# Patient Record
Sex: Female | Born: 1985 | Race: White | Hispanic: No | Marital: Married | Smoking: Never smoker
Health system: Southern US, Community
[De-identification: ages and names within clinical notes are randomized; demographics above are authoritative.]

## PROBLEM LIST (undated history)

## (undated) ENCOUNTER — Inpatient Hospital Stay: Payer: Self-pay

## (undated) DIAGNOSIS — R42 Dizziness and giddiness: Secondary | ICD-10-CM

## (undated) DIAGNOSIS — G90A Postural orthostatic tachycardia syndrome (POTS): Secondary | ICD-10-CM

## (undated) DIAGNOSIS — R000 Tachycardia, unspecified: Secondary | ICD-10-CM

## (undated) DIAGNOSIS — I498 Other specified cardiac arrhythmias: Secondary | ICD-10-CM

## (undated) DIAGNOSIS — K08409 Partial loss of teeth, unspecified cause, unspecified class: Secondary | ICD-10-CM

## (undated) DIAGNOSIS — I951 Orthostatic hypotension: Secondary | ICD-10-CM

## (undated) HISTORY — DX: Dizziness and giddiness: R42

## (undated) HISTORY — DX: Tachycardia, unspecified: R00.0

---

## 2010-07-21 ENCOUNTER — Encounter: Payer: Self-pay | Admitting: Internal Medicine

## 2011-04-16 ENCOUNTER — Encounter: Payer: Self-pay | Admitting: Internal Medicine

## 2011-04-16 ENCOUNTER — Other Ambulatory Visit (HOSPITAL_COMMUNITY)
Admission: RE | Admit: 2011-04-16 | Discharge: 2011-04-16 | Disposition: A | Discharge status: Home / Self Care | Payer: BC Managed Care – PPO | Source: Ambulatory Visit | Attending: Obstetrics & Gynecology | Admitting: Obstetrics & Gynecology

## 2011-04-16 DIAGNOSIS — Z124 Encounter for screening for malignant neoplasm of cervix: Secondary | ICD-10-CM | POA: Insufficient documentation

## 2011-04-16 DIAGNOSIS — Z113 Encounter for screening for infections with a predominantly sexual mode of transmission: Secondary | ICD-10-CM | POA: Insufficient documentation

## 2011-06-04 ENCOUNTER — Ambulatory Visit (INDEPENDENT_AMBULATORY_CARE_PROVIDER_SITE_OTHER): Payer: BC Managed Care – PPO | Admitting: Internal Medicine

## 2011-06-04 ENCOUNTER — Encounter: Payer: Self-pay | Admitting: Internal Medicine

## 2011-06-04 VITALS — BP 94/60 | HR 128 | Ht 64.5 in | Wt 124.0 lb

## 2011-06-04 DIAGNOSIS — R000 Tachycardia, unspecified: Secondary | ICD-10-CM

## 2011-06-04 DIAGNOSIS — I951 Orthostatic hypotension: Secondary | ICD-10-CM

## 2011-06-04 DIAGNOSIS — R42 Dizziness and giddiness: Secondary | ICD-10-CM

## 2011-06-04 NOTE — Assessment & Plan Note (Addendum)
[name] has what appears to be sinus tachycardia. There is heart rate variability to suggest appropriate autonomic input. She is largely asymptomatic with this. Her diet sounds like it is salt deplete and this might provide a low risk therapeutic option. The need to do anything would be driven by his symptoms and/or evidence of left ventricular dysfunction which occurs very infrequently with inappropriate sinus tachycardia. It is important to exclude atrial tachycardia as this might be associated with left ventricular dysfunction. Frequently but not always atrial tachycardia have less heart rate variability and inappropriate sinus tachycardia; clearly her P wave morphologies did not speak towards the former. I should note that her hematocrit was 36.9 and her TSH was 2.21

## 2011-06-04 NOTE — Progress Notes (Signed)
   History and Physical  Patient ID: Geoffry Paradise MRN: [id number], SOB: [birth year] 25 y.o. Date of Encounter: 06/04/2011, 3:52 PM  Primary Physician: No primary provider on file.  Chief Complaint:   History of Present Illness: Vivia Budge Lillia Mountain is a 26 y.o. female referred from [facility] because of tachycardia.  She was first noted to have tachycardia about 10 or 12 years ago. 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 She underwent an echocardiogram which apparently was normal. She continued to play sports through high school without any exertional limitations. She did however no lightheadedness in between points of tenderness.  She has some problems with orthostatic lightheadedness now. Her symptoms are worse around the time of her period. She does not have shower intolerance. She does have some degree of heat intolerance. Her diet is rather fluid replete; it is probably salt deplete.    She notes episodic tachypalpitations lasting just seconds.  She denies dry eyes dry mouth or problems with constipation. She does have some problems with urinary bladder control with some dribbling occurring without significant warning  Past Medical History  Diagnosis Date  . Tachycardia   . Orthostatic lightheadedness     Mouth surgery  Current Outpatient Prescriptions  Medication Sig Dispense Refill  . Calcium Carb-Cholecalciferol [phone] MG-UNIT CAPS Take 2 capsules by mouth daily.      . cyanocobalamin 100 MCG tablet Take 100 mcg by mouth daily.      . Multiple Vitamin (MULTIVITAMIN) tablet Take 1 tablet by mouth daily.      . Omega-3 Fatty Acids (FISH OIL) 1000 MG CAPS Take 2 capsules by mouth.         Allergies: No Known Allergies   History  Substance Use Topics  . Smoking status: Never Smoker   . Smokeless tobacco: Never Used  . Alcohol Use: Yes     2 glasses of wine a week        No family history on file.    ROS:  Please see the history of present illness.   Negative except anemia and fatigue  All other systems reviewed and negative.   Vital Signs: Blood pressure 106/78, pulse 103, height 5' 4.5" (1.638 m), weight 124 lb (56.246 kg).  PHYSICAL EXAM: General:  Well nourished, well developed female in no acute distress HEENT: normal Lymph: no adenopathy Neck: no JVD Endocrine:  No thryomegaly Vascular: No carotid bruits; FA pulses 2+ bilaterally without bruits Cardiac:  normal S1, S2; RRR; no murmur Back: without kyphosis/scoliosis, no CVA tenderness Lungs:  clear to auscultation bilaterally, no wheezing, rhonchi or rales Abd: soft, nontender, no hepatomegaly Ext: no edema Musculoskeletal:  No deformities, BUE and BLE strength normal and equal Skin: warm and dry Neuro:  CNs 2-12 intact, no focal abnormalities noted Psych:  Normal affect   EKG:  Sinus at 104 There is significant heart rate variability with inspiration/expiration  Labs:    ASSESSMENT AND PLAN:       I

## 2011-06-04 NOTE — Assessment & Plan Note (Signed)
Likely related to the above and I suspect that these both represent dysautonomia although with only minimal symptoms.

## 2011-06-10 NOTE — Progress Notes (Signed)
Addended by: Vista Mink D on: 06/10/2011 04:58 PM   Modules accepted: Orders

## 2012-02-27 ENCOUNTER — Telehealth: Payer: Self-pay | Admitting: Internal Medicine

## 2012-02-27 NOTE — Telephone Encounter (Signed)
Maureen with [facility] calling re letter pt got from dr Shary Key, stating no exercise , maureen wants to confirm that he meant NO exercise or exercise with limitations? pls call with verbal

## 2012-02-27 NOTE — Telephone Encounter (Signed)
Will forward to Dr. Graciela Husbands and Sherri Rad for review.

## 2012-03-01 NOTE — Telephone Encounter (Signed)
i am not sure that she need have ANY exercise restrications  There may have been some mistake thanks

## 2012-03-05 NOTE — Telephone Encounter (Signed)
Spoke with [name], aware of dr [employer] recommendations.

## 2013-11-23 DIAGNOSIS — I498 Other specified cardiac arrhythmias: Secondary | ICD-10-CM | POA: Insufficient documentation

## 2013-11-23 DIAGNOSIS — G90A Postural orthostatic tachycardia syndrome (POTS): Secondary | ICD-10-CM | POA: Insufficient documentation

## 2013-11-25 LAB — OB RESULTS CONSOLE ABO/RH: RH Type: POSITIVE

## 2013-11-25 LAB — OB RESULTS CONSOLE HEPATITIS B SURFACE ANTIGEN: Hepatitis B Surface Ag: NEGATIVE

## 2013-11-25 LAB — OB RESULTS CONSOLE GC/CHLAMYDIA
Chlamydia: NEGATIVE
Gonorrhea: NEGATIVE

## 2013-11-25 LAB — OB RESULTS CONSOLE HIV ANTIBODY (ROUTINE TESTING): HIV: NONREACTIVE

## 2013-11-25 LAB — OB RESULTS CONSOLE RUBELLA ANTIBODY, IGM: RUBELLA: UNDETERMINED

## 2013-11-25 LAB — OB RESULTS CONSOLE GBS: STREP GROUP B AG: NEGATIVE

## 2013-11-25 LAB — OB RESULTS CONSOLE VARICELLA ZOSTER ANTIBODY, IGG: Varicella: IMMUNE

## 2013-12-09 ENCOUNTER — Encounter: Payer: Self-pay | Admitting: Obstetrics and Gynecology

## 2014-02-04 NOTE — L&D Delivery Note (Signed)
Cesarean Section Procedure Note  Indications: cephalo-pelvic disproportion, chorioamnionitis, meconium   Pre-operative Diagnosis: same  Post-operative Diagnosis: same  Surgeon: Jennell CornerSCHERMERHORN,THOMAS   Assistants:jones  Anesthesia: Epidural anesthesia  ASA Class:   Procedure Details    Findings: Meconium stained , active female with spontaneous cry on abdomen . apgars 8/9  Delivery 2035 wt 3400 gm  Estimated Blood Loss:  600         Drains:          Total IV Fluids:  1000ml         Specimens: Placenta          Implants:          Complications:  None; patient tolerated the procedure well.         Disposition: PACU - hemodynamically stable.         Condition: stable  Attending Attestation:      Delivery Note At 8:35 PM a viable female was delivered via C-Section, low transverse  (Presentation: ;  ).  APGAR: 8/9; weight 7 lb 7.9 oz (3400 g).   Placenta status: , .  Cord:  with the following complications: .  Cord pH: not done   Anesthesia: Epidural  Episiotomy:   Lacerations:   Suture Repair:  Est. Blood Loss (mL):    Mom to postpartum.  Baby to Couplet care / Skin to Skin.  SCHERMERHORN,THOMAS 06/30/2014, 9:17 PM

## 2014-06-07 ENCOUNTER — Observation Stay
Admission: RE | Admit: 2014-06-07 | Discharge: 2014-06-07 | Disposition: A | Discharge status: Home / Self Care | Payer: BLUE CROSS/BLUE SHIELD | Attending: Obstetrics and Gynecology | Admitting: Obstetrics and Gynecology

## 2014-06-07 DIAGNOSIS — O368130 Decreased fetal movements, third trimester, not applicable or unspecified: Principal | ICD-10-CM | POA: Insufficient documentation

## 2014-06-07 DIAGNOSIS — O368190 Decreased fetal movements, unspecified trimester, not applicable or unspecified: Secondary | ICD-10-CM | POA: Diagnosis present

## 2014-06-27 ENCOUNTER — Observation Stay
Admission: RE | Admit: 2014-06-27 | Discharge: 2014-06-27 | Disposition: A | Discharge status: Home / Self Care | Payer: BLUE CROSS/BLUE SHIELD | Attending: Obstetrics and Gynecology | Admitting: Obstetrics and Gynecology

## 2014-06-27 ENCOUNTER — Encounter: Payer: Self-pay | Admitting: *Deleted

## 2014-06-27 DIAGNOSIS — O368131 Decreased fetal movements, third trimester, fetus 1: Secondary | ICD-10-CM

## 2014-06-27 DIAGNOSIS — Z3493 Encounter for supervision of normal pregnancy, unspecified, third trimester: Principal | ICD-10-CM | POA: Insufficient documentation

## 2014-06-27 DIAGNOSIS — O288 Other abnormal findings on antenatal screening of mother: Secondary | ICD-10-CM | POA: Diagnosis present

## 2014-06-27 HISTORY — DX: Postural orthostatic tachycardia syndrome (POTS): G90.A

## 2014-06-27 HISTORY — DX: Other specified cardiac arrhythmias: I49.8

## 2014-06-27 HISTORY — DX: Partial loss of teeth, unspecified cause, unspecified class: K08.409

## 2014-06-27 HISTORY — DX: Tachycardia, unspecified: R00.0

## 2014-06-27 HISTORY — DX: Orthostatic hypotension: I95.1

## 2014-06-27 MED ORDER — ACETAMINOPHEN 325 MG PO TABS: 650.0000 mg | ORAL_TABLET | ORAL | Status: DC | PRN

## 2014-06-27 NOTE — OB Triage Note (Signed)
Was seen this AM for NST r/t post dates.  Nonreactive in the office.  To Labor and delivery for monitoring. Camelia PhenesHall,Jinnie Onley F, RN 06/27/2014 11:44 AM

## 2014-06-29 ENCOUNTER — Inpatient Hospital Stay
Admission: EM | Admit: 2014-06-29 | Discharge: 2014-07-03 | DRG: 765 | Disposition: A | Discharge status: Home / Self Care | Payer: BLUE CROSS/BLUE SHIELD | Attending: Obstetrics and Gynecology | Admitting: Obstetrics and Gynecology

## 2014-06-29 DIAGNOSIS — Z79899 Other long term (current) drug therapy: Secondary | ICD-10-CM | POA: Diagnosis not present

## 2014-06-29 DIAGNOSIS — O480 Post-term pregnancy: Secondary | ICD-10-CM | POA: Diagnosis present

## 2014-06-29 DIAGNOSIS — O339 Maternal care for disproportion, unspecified: Secondary | ICD-10-CM | POA: Diagnosis present

## 2014-06-29 DIAGNOSIS — Z3A40 40 weeks gestation of pregnancy: Secondary | ICD-10-CM | POA: Diagnosis present

## 2014-06-29 DIAGNOSIS — O411230 Chorioamnionitis, third trimester, not applicable or unspecified: Secondary | ICD-10-CM | POA: Diagnosis present

## 2014-06-29 DIAGNOSIS — Z9889 Other specified postprocedural states: Secondary | ICD-10-CM

## 2014-06-29 LAB — CBC
HCT: 35.9 % (ref 35.0–47.0)
Hemoglobin: 12.2 g/dL (ref 12.0–16.0)
MCH: 30.9 pg (ref 26.0–34.0)
MCHC: 33.9 g/dL (ref 32.0–36.0)
MCV: 91.2 fL (ref 80.0–100.0)
PLATELETS: 216 10*3/uL (ref 150–440)
RBC: 3.94 MIL/uL (ref 3.80–5.20)
RDW: 13.6 % (ref 11.5–14.5)
WBC: 11.3 10*3/uL — ABNORMAL HIGH (ref 3.6–11.0)

## 2014-06-29 LAB — ABO/RH: ABO/RH(D): AB POS

## 2014-06-29 LAB — TYPE AND SCREEN
ABO/RH(D): AB POS
Antibody Screen: NEGATIVE

## 2014-06-29 MED ORDER — OXYTOCIN BOLUS FROM INFUSION: 500.0000 mL | INTRAVENOUS | Status: DC

## 2014-06-29 MED ORDER — LACTATED RINGERS IV SOLN
500.0000 mL | INTRAVENOUS | Status: DC | PRN
  Administered 2014-06-30: 500 mL via INTRAVENOUS

## 2014-06-29 MED ORDER — LACTATED RINGERS IV SOLN
INTRAVENOUS | Status: DC
  Administered 2014-06-29 – 2014-06-30 (×7): via INTRAVENOUS

## 2014-06-29 MED ORDER — ZOLPIDEM TARTRATE 5 MG PO TABS
5.0000 mg | ORAL_TABLET | Freq: Every evening | ORAL | Status: DC | PRN
  Administered 2014-06-29: 5 mg via ORAL

## 2014-06-29 MED ORDER — CITRIC ACID-SODIUM CITRATE 334-500 MG/5ML PO SOLN: 30.0000 mL | ORAL | Status: DC | PRN

## 2014-06-29 MED ORDER — ZOLPIDEM TARTRATE 5 MG PO TABS
ORAL_TABLET | ORAL | Status: AC
  Administered 2014-06-29: 5 mg via ORAL
  Filled 2014-06-29: qty 1

## 2014-06-29 MED ORDER — ACETAMINOPHEN 325 MG PO TABS: 650.0000 mg | ORAL_TABLET | ORAL | Status: DC | PRN

## 2014-06-29 MED ORDER — OXYTOCIN 40 UNITS IN LACTATED RINGERS INFUSION - SIMPLE MED
62.5000 mL/h | INTRAVENOUS | Status: DC
  Administered 2014-06-30: 1000 mL via INTRAVENOUS
  Administered 2014-06-30: 62.5 mL/h via INTRAVENOUS

## 2014-06-29 MED ORDER — BUTORPHANOL TARTRATE 1 MG/ML IJ SOLN
1.0000 mg | INTRAMUSCULAR | Status: DC | PRN
  Administered 2014-06-30 (×2): 1 mg via INTRAVENOUS

## 2014-06-29 MED ORDER — LIDOCAINE HCL (PF) 1 % IJ SOLN
30.0000 mL | INTRAMUSCULAR | Status: DC | PRN
  Filled 2014-06-29: qty 30

## 2014-06-29 MED ORDER — DINOPROSTONE 10 MG VA INST
10.0000 mg | VAGINAL_INSERT | Freq: Once | VAGINAL | Status: AC
  Administered 2014-06-29: 10 mg via VAGINAL
  Filled 2014-06-29: qty 1

## 2014-06-29 MED ORDER — ONDANSETRON HCL 4 MG/2ML IJ SOLN
4.0000 mg | Freq: Four times a day (QID) | INTRAMUSCULAR | Status: DC | PRN
  Administered 2014-06-30: 4 mg via INTRAVENOUS

## 2014-06-29 NOTE — H&P (Signed)
HISTORY AND PHYSICAL  HISTORY OF PRESENT ILLNESS: [name] is a 29 y.o. G1P0 at 6566w6d by LMP consistent with first trimester ultrasound with a pregnancy complicated by POTS presenting for induction of labor. She has had no instances of POTS this pregnancy. She has passed post-dates screening this week well.   She has some been having contractions Q5-6, but they are not painful, and denies leakage of fluid, vaginal bleeding, or decreased fetal movement.   REVIEW OF SYSTEMS: A complete review of systems was performed and was specifically negative for headache, changes in vision, RUQ pain, shortness of breath, chest pain, lower extremity edema and dysuria.   HISTORY:  Past Medical History  Diagnosis Date  . Wisdom teeth extracted   . POTS (postural orthostatic tachycardia syndrome)     History reviewed. No pertinent past surgical history.  No current facility-administered medications on file prior to encounter.   Current Outpatient Prescriptions on File Prior to Encounter  Medication Sig Dispense Refill  . ferrous sulfate 325 (65 FE) MG tablet Take 325 mg by mouth daily with breakfast.    . Prenatal Vit-Fe Fumarate-FA (PRENATAL MULTIVITAMIN) TABS tablet Take 1 tablet by mouth daily at 12 noon.       No Known Allergies  OB History  Gravida Para Term Preterm AB SAB TAB Ectopic Multiple Living  1             # Outcome Date GA Lbr Len/2nd Weight Sex Delivery Anes PTL Lv  1 Current               Gynecologic History: History of Abnormal Pap Smear: Normal this gestation History of STI: G/C negative  History  Substance Use Topics  . Smoking status: Never Smoker   . Smokeless tobacco: Not on file  . Alcohol Use: No    PHYSICAL EXAM: Temp:  [97.7 F (36.5 C)] 97.7 F (36.5 C) (05/25 2004) Pulse Rate:  [105] 105 (05/25 2004) Resp:  [18] 18 (05/25 2004) BP: (116)/(77) 116/77 mmHg (05/25 2004) Weight:  [71.215 kg (157 lb)] 71.215 kg (157 lb) (05/25 2054)  GENERAL: NAD  AAOx3 CHEST:CTAB no increased work of breathing CV:RRR no appreciable murmurs, rubs, gallops ABDOMEN: gravid, nontender, EFW 3200g by [place] EXTREMITIES:  Warm and well-perfused, nontender, nonedematous, 2 DTRs no clonus CERVIX: FT/ 25/ -3 posterior SPECULUM: deferred  FHT:140s baseline with mod variability + accelerations and no decelerations  Toco: Q5  DIAGNOSTIC STUDIES:  Recent Labs Lab 06/29/14 2034  WBC 11.3*  HGB 12.2  HCT 35.9  PLT 216    PRENATAL STUDIES:  Prenatal Labs:  MBT: AB+; Rubella non- immune, Varicella immune, HIV neg, RPR neg, Hep B neg, GC/CT neg, GBS neg, glucola 88   ASSESSMENT AND PLAN:  1. Fetal Well being  - Fetal Tracing: Cat 1 - Ultrasound: outpt reviewed, well-grown, no previa - Group B Streptococcus: neg - Presentation: VTX confirmed by RN   2. Routine OB: - Prenatal labs reviewed, as above - Rh +  3. Induction of Labor:  -  Contractions non-painful external toco in place -  Pelvis unproven -  Plan for induction with Cervidil overnight  4. Post Partum Planning: - Infant feeding: to be discussed - Contraception: to be discussed

## 2014-06-30 ENCOUNTER — Encounter
Admission: EM | Disposition: A | Discharge status: Home / Self Care | Payer: Self-pay | Source: Home / Self Care | Attending: Obstetrics and Gynecology

## 2014-06-30 ENCOUNTER — Inpatient Hospital Stay: Payer: BLUE CROSS/BLUE SHIELD | Admitting: Anesthesiology

## 2014-06-30 SURGERY — Surgical Case
Wound class: Clean Contaminated

## 2014-06-30 MED ORDER — NALBUPHINE HCL 10 MG/ML IJ SOLN: 5.0000 mg | INTRAMUSCULAR | Status: DC | PRN

## 2014-06-30 MED ORDER — SCOPOLAMINE 1 MG/3DAYS TD PT72
1.0000 | MEDICATED_PATCH | Freq: Once | TRANSDERMAL | Status: DC
  Filled 2014-06-30: qty 1

## 2014-06-30 MED ORDER — FENTANYL 2.5 MCG/ML W/ROPIVACAINE 0.2% IN NS 100 ML EPIDURAL INFUSION (ARMC-ANES)
9.0000 mL/h | EPIDURAL | Status: DC
  Administered 2014-06-30 (×3): 9 mL/h via EPIDURAL

## 2014-06-30 MED ORDER — FENTANYL 2.5 MCG/ML W/ROPIVACAINE 0.2% IN NS 100 ML EPIDURAL INFUSION (ARMC-ANES)
EPIDURAL | Status: AC
  Administered 2014-06-30: 9 mL/h via EPIDURAL
  Filled 2014-06-30: qty 100

## 2014-06-30 MED ORDER — BUTORPHANOL TARTRATE 1 MG/ML IJ SOLN
INTRAMUSCULAR | Status: AC
Start: 2014-06-30 — End: 2014-06-30
  Administered 2014-06-30: 1 mg via INTRAVENOUS
  Filled 2014-06-30: qty 1

## 2014-06-30 MED ORDER — AMMONIA AROMATIC IN INHA
RESPIRATORY_TRACT | Status: AC
  Filled 2014-06-30: qty 10

## 2014-06-30 MED ORDER — NALOXONE HCL 0.4 MG/ML IJ SOLN: 0.4000 mg | INTRAMUSCULAR | Status: DC | PRN

## 2014-06-30 MED ORDER — MISOPROSTOL 200 MCG PO TABS
ORAL_TABLET | ORAL | Status: AC
Start: 2014-06-30 — End: 2014-07-01
  Filled 2014-06-30: qty 2

## 2014-06-30 MED ORDER — NALBUPHINE HCL 10 MG/ML IJ SOLN: 5.0000 mg | Freq: Once | INTRAMUSCULAR | Status: AC | PRN

## 2014-06-30 MED ORDER — DIPHENHYDRAMINE HCL 50 MG/ML IJ SOLN: 12.5000 mg | INTRAMUSCULAR | Status: DC | PRN

## 2014-06-30 MED ORDER — LIDOCAINE HCL (PF) 1 % IJ SOLN
INTRAMUSCULAR | Status: AC
  Filled 2014-06-30: qty 30

## 2014-06-30 MED ORDER — BUTORPHANOL TARTRATE 1 MG/ML IJ SOLN
1.0000 mg | INTRAMUSCULAR | Status: DC | PRN
  Administered 2014-06-30: 1 mg via INTRAVENOUS

## 2014-06-30 MED ORDER — METHYLERGONOVINE MALEATE 0.2 MG/ML IJ SOLN
INTRAMUSCULAR | Status: AC
  Administered 2014-06-30: 0.2 mg via INTRAMUSCULAR
  Filled 2014-06-30: qty 1

## 2014-06-30 MED ORDER — MORPHINE SULFATE (PF) 0.5 MG/ML IJ SOLN
INTRAMUSCULAR | Status: DC | PRN
  Administered 2014-06-30: 2 mg via EPIDURAL

## 2014-06-30 MED ORDER — TERBUTALINE SULFATE 1 MG/ML IJ SOLN: 0.2500 mg | Freq: Once | INTRAMUSCULAR | Status: AC | PRN

## 2014-06-30 MED ORDER — DEXTROSE 5 % IV SOLN
1.0000 ug/kg/h | INTRAVENOUS | Status: DC | PRN
  Filled 2014-06-30: qty 2

## 2014-06-30 MED ORDER — TERBUTALINE SULFATE 1 MG/ML IJ SOLN: 0.2500 mg | Freq: Once | INTRAMUSCULAR | Status: DC

## 2014-06-30 MED ORDER — PHENYLEPHRINE HCL 10 MG/ML IJ SOLN
INTRAMUSCULAR | Status: DC | PRN
  Administered 2014-06-30: 100 ug via INTRAVENOUS

## 2014-06-30 MED ORDER — EPHEDRINE 5 MG/ML INJ
10.0000 mg | INTRAVENOUS | Status: DC | PRN
  Filled 2014-06-30: qty 2

## 2014-06-30 MED ORDER — SODIUM CHLORIDE 0.9 % IJ SOLN: 3.0000 mL | INTRAMUSCULAR | Status: DC | PRN

## 2014-06-30 MED ORDER — MEPERIDINE HCL 25 MG/ML IJ SOLN
INTRAMUSCULAR | Status: AC
  Administered 2014-06-30: 6.25 mg via INTRAVENOUS
  Filled 2014-06-30: qty 1

## 2014-06-30 MED ORDER — MEPERIDINE HCL 25 MG/ML IJ SOLN
6.2500 mg | INTRAMUSCULAR | Status: AC | PRN
  Administered 2014-06-30 (×2): 6.25 mg via INTRAVENOUS

## 2014-06-30 MED ORDER — ACETAMINOPHEN 500 MG PO TABS
ORAL_TABLET | ORAL | Status: AC
  Administered 2014-06-30: 1000 mg via ORAL
  Filled 2014-06-30: qty 2

## 2014-06-30 MED ORDER — DIPHENHYDRAMINE HCL 25 MG PO CAPS: 25.0000 mg | ORAL_CAPSULE | ORAL | Status: DC | PRN

## 2014-06-30 MED ORDER — BUPIVACAINE HCL (PF) 0.25 % IJ SOLN
INTRAMUSCULAR | Status: DC | PRN
  Administered 2014-06-30: 3 mL
  Administered 2014-06-30: 5 mL

## 2014-06-30 MED ORDER — CEFAZOLIN SODIUM-DEXTROSE 2-3 GM-% IV SOLR
INTRAVENOUS | Status: AC
  Administered 2014-06-30: 2 g via INTRAVENOUS
  Filled 2014-06-30: qty 50

## 2014-06-30 MED ORDER — SODIUM CHLORIDE 0.9 % IV SOLN
2.0000 g | Freq: Once | INTRAVENOUS | Status: AC
  Administered 2014-06-30: 2 g via INTRAVENOUS

## 2014-06-30 MED ORDER — ONDANSETRON HCL 4 MG/2ML IJ SOLN: 4.0000 mg | Freq: Three times a day (TID) | INTRAMUSCULAR | Status: DC | PRN

## 2014-06-30 MED ORDER — PHENYLEPHRINE 40 MCG/ML (10ML) SYRINGE FOR IV PUSH (FOR BLOOD PRESSURE SUPPORT)
80.0000 ug | PREFILLED_SYRINGE | INTRAVENOUS | Status: DC | PRN
  Filled 2014-06-30: qty 2

## 2014-06-30 MED ORDER — ACETAMINOPHEN 500 MG PO TABS
1000.0000 mg | ORAL_TABLET | Freq: Once | ORAL | Status: AC
  Administered 2014-06-30: 1000 mg via ORAL

## 2014-06-30 MED ORDER — CITRIC ACID-SODIUM CITRATE 334-500 MG/5ML PO SOLN
ORAL | Status: AC
  Administered 2014-06-30: 20:00:00
  Filled 2014-06-30: qty 15

## 2014-06-30 MED ORDER — LIDOCAINE HCL (PF) 2 % IJ SOLN
INTRAMUSCULAR | Status: DC | PRN
  Administered 2014-06-30 (×2): 10 mL via INTRADERMAL

## 2014-06-30 MED ORDER — OXYTOCIN 40 UNITS IN LACTATED RINGERS INFUSION - SIMPLE MED
INTRAVENOUS | Status: AC
  Administered 2014-06-30: 62.5 mL/h via INTRAVENOUS
  Filled 2014-06-30: qty 1000

## 2014-06-30 MED ORDER — TERBUTALINE SULFATE 1 MG/ML IJ SOLN
INTRAMUSCULAR | Status: AC
  Filled 2014-06-30: qty 1

## 2014-06-30 MED ORDER — BUTORPHANOL TARTRATE 1 MG/ML IJ SOLN
INTRAMUSCULAR | Status: AC
  Administered 2014-06-30: 1 mg via INTRAVENOUS
  Filled 2014-06-30: qty 1

## 2014-06-30 MED ORDER — AMPICILLIN SODIUM 2 G IJ SOLR
INTRAMUSCULAR | Status: AC
  Filled 2014-06-30: qty 2000

## 2014-06-30 MED ORDER — LIDOCAINE HCL (PF) 1 % IJ SOLN
INTRAMUSCULAR | Status: DC | PRN
  Administered 2014-06-30: 3 mL

## 2014-06-30 MED ORDER — OXYTOCIN 10 UNIT/ML IJ SOLN
INTRAMUSCULAR | Status: AC
  Filled 2014-06-30: qty 2

## 2014-06-30 MED ORDER — OXYTOCIN 40 UNITS IN LACTATED RINGERS INFUSION - SIMPLE MED
INTRAVENOUS | Status: AC
  Administered 2014-06-30: 1 m[IU]/min via INTRAVENOUS
  Filled 2014-06-30: qty 1000

## 2014-06-30 MED ORDER — IBUPROFEN 600 MG PO TABS
600.0000 mg | ORAL_TABLET | Freq: Four times a day (QID) | ORAL | Status: DC | PRN
  Filled 2014-06-30 (×7): qty 1

## 2014-06-30 MED ORDER — OXYTOCIN 40 UNITS IN LACTATED RINGERS INFUSION - SIMPLE MED
1.0000 m[IU]/min | INTRAVENOUS | Status: DC
  Administered 2014-06-30: 1 m[IU]/min via INTRAVENOUS

## 2014-06-30 SURGICAL SUPPLY — 21 items
BARRIER ADHS 3X4 INTERCEED (GAUZE/BANDAGES/DRESSINGS) ×3 IMPLANT
CANISTER SUCT 3000ML (MISCELLANEOUS) ×3 IMPLANT
CATH KIT ON-Q SILVERSOAK 5IN (CATHETERS) IMPLANT
CHLORAPREP W/TINT 26ML (MISCELLANEOUS) ×3 IMPLANT
DRSG TELFA 3X8 NADH (GAUZE/BANDAGES/DRESSINGS) ×3 IMPLANT
ELECT CAUTERY BLADE 6.4 (BLADE) ×3 IMPLANT
GAUZE SPONGE 4X4 12PLY STRL (GAUZE/BANDAGES/DRESSINGS) ×3 IMPLANT
GLOVE BIO SURGEON STRL SZ8 (GLOVE) ×3 IMPLANT
GOWN STRL REUS W/ TWL LRG LVL3 (GOWN DISPOSABLE) ×2 IMPLANT
GOWN STRL REUS W/ TWL XL LVL3 (GOWN DISPOSABLE) ×1 IMPLANT
GOWN STRL REUS W/TWL LRG LVL3 (GOWN DISPOSABLE) ×4
GOWN STRL REUS W/TWL XL LVL3 (GOWN DISPOSABLE) ×2
NS IRRIG 1000ML POUR BTL (IV SOLUTION) ×3 IMPLANT
PACK C SECTION AR (MISCELLANEOUS) ×3 IMPLANT
PAD GROUND ADULT SPLIT (MISCELLANEOUS) ×3 IMPLANT
PAD OB MATERNITY 4.3X12.25 (PERSONAL CARE ITEMS) ×3 IMPLANT
PAD PREP 24X41 OB/GYN DISP (PERSONAL CARE ITEMS) ×3 IMPLANT
STRAP SAFETY BODY (MISCELLANEOUS) ×3 IMPLANT
SUT CHROMIC 1 CTX 36 (SUTURE) ×9 IMPLANT
SUT PLAIN GUT 0 (SUTURE) ×6 IMPLANT
SUT VIC AB 0 CT1 36 (SUTURE) ×6 IMPLANT

## 2014-06-30 NOTE — Progress Notes (Signed)
Kristine SeniorMeredith Fry Fry is a 29 y.o. G1P0 at 6327w0d by LMP admitted for induction of labor due to Post dates. Due date 06/23/14. .  Subjective: Feeling pain with UC's.  Objective: BP 100/62 mmHg  Pulse 101  Temp(Src) 97.7 F (36.5 C) (Oral)  Resp 16  Ht 5\' 4"  (1.626 m)  Wt 71.215 kg (157 lb)  BMI 26.94 kg/m2      FHT:  FHR: 150, mod variability, no decels bpm, variability: FHR: 150, mod variabilty bpm, variability: moderate,  accelerations:  Present,  decelerations:  Absent,  accelerations:  Present,  decelerations:  Absent UC:   irregular, every 3  minutes SVE:   Dilation: 8 Effacement (%): 90 Exam by:: K. Yates  Labs: Lab Results  Component Value Date   WBC 11.3* 06/29/2014   HGB 12.2 06/29/2014   HCT 35.9 06/29/2014   MCV 91.2 06/29/2014   PLT 216 06/29/2014    Assessment / Plan: Spontaneous labor, progressing normally  Labor: Progressing normally Preeclampsia:  none Fetal Wellbeing:  Category I Pain Control:  10 on 1-10 scale I/D:  n/a Anticipated MOD:  NSVD  [name], [name] 06/30/2014, 8:18 AM

## 2014-06-30 NOTE — Progress Notes (Signed)
Patient ID: [name], female   DOB: [birth year], 29 y.o.   MRN: [id number]  Pt having late decels with pushing, repositioned and worse on back bu, pushing well on Lt side. T 98.6 but, feels warm. FHR had 4-5 late decels with marked variability and has resolved x last few contractions. Pt is moving the baby and pushing well.

## 2014-06-30 NOTE — Progress Notes (Signed)
Kristine SeniorMeredith W Fry is a 29 y.o. G1P0 at 4977w0d by LMP admitted for induction of labor due to Post dates. Due date.Pt is now comfortable.  Subjective:   Objective: BP 93/50 mmHg  Pulse 103  Temp(Src) 97.7 F (36.5 C) (Oral)  Resp 18  Ht 5\' 4"  (1.626 m)  Wt 71.215 kg (157 lb)  BMI 26.94 kg/m2  SpO2 99%      FHT: 145, +accels, no decels.  UC:   regular, every 2-4 mins, mod to palp. SVE:   Dilation: 8 Effacement (%): 90 Exam by:: Milon Scorearon Jones, CNM Station: -2/-1 Labs: Lab Results  Component Value Date   WBC 11.3* 06/29/2014   HGB 12.2 06/29/2014   HCT 35.9 06/29/2014   MCV 91.2 06/29/2014   PLT 216 06/29/2014    Assessment / Plan: Spontaneous labor, progressing normally  Labor: Progressing normally Pain in control with epidural. FHR: reassuring, reactive NST.  AROM for mod mec, no particulate noted at 1044am. Foley being placed.   [name], CARON W 06/30/2014, 10:47 AM

## 2014-06-30 NOTE — Progress Notes (Signed)
[name] is a [age] y.o. G1P0 at 7913w0d by LMP admitted for induction of labor due to Post dates.   Subjective: NO pressure with UC's  Objective: BP 95/58 mmHg  Pulse 126  Temp(Src) 98.7 F (37.1 C) (Oral)  Resp 16  Ht 5\' 4"  (1.626 m)  Wt 71.215 kg (157 lb)  BMI 26.94 kg/m2  SpO2 100%   Total I/O In: 2468.2 [I.V.:2468.2] Out: -   FHT:  CTSP due to variable to the 70's with recovery after 60 secs, marked variability. Cat II due to variable noted. And now recovered to 150.  UC:   regular, every 2.5 to 3 miinutes SVE:   Dilation: 10 Effacement (%): 100 Station: -2, -1 Exam by:: k. yates, rn  Labs: Lab Results  Component Value Date   WBC 11.3* 06/29/2014   HGB 12.2 06/29/2014   HCT 35.9 06/29/2014   MCV 91.2 06/29/2014   PLT 216 06/29/2014    Assessment / Plan: Induction of labor due to for postdates with slow descent and progress,  progressing well on pitocin  Labor: active labor Preeclampsia:  none Fetal Wellbeing:  Category II, now Cat I Pain Control:  Epidural I/D:  none Anticipated MOD:  1. Will restart Pitocin in 20 mins. 2. Dr Feliberto GottronSchermerhorn aware of pt status. 3. Will recheck pt in 30 mins and if able will start pushing.  [name], [name] 06/30/2014, 4:53 PM

## 2014-06-30 NOTE — Plan of Care (Signed)
[id number] Cervical exam 3cm 90%. Cervidil removed

## 2014-06-30 NOTE — Progress Notes (Signed)
[name] is a 29 y.o. G1P0 at 4650w0d by LMP admiJeannett Seniortted for induction of labor due to Post dates. Pt has mod meconium.   Subjective:   Objective: BP 137/111 mmHg  Pulse 82  Temp(Src) 101.4 F (38.6 C) (Axillary)  Resp 18  Ht 5\' 4"  (1.626 m)  Wt 71.215 kg (157 lb)  BMI 26.94 kg/m2  SpO2 100% I/O last 3 completed shifts: In: 4094.4 [I.V.:3344.4; Other:750] Out: -     FHT:  FHR: 150, Cat II, some late decels to 90. bpm, variability: marked,  accelerations:  Present,  decelerations:  Present late decels with marked varibility with pushing UC:   regular, every  minutes SVE:   Dilation: 10 Effacement (%): 100 Station: -1, 0 Exam by:: k. yates, rn  Labs: Lab Results  Component Value Date   WBC 11.3* 06/29/2014   HGB 12.2 06/29/2014   HCT 35.9 06/29/2014   MCV 91.2 06/29/2014   PLT 216 06/29/2014    Assessment / Plan: pushing vtx from -1 to +1 and making progress at 41 weeks.   Labor: q 2 mins  Preeclampsia:  none Fetal Wellbeing:  Category II Pain Control:  Epidural I/D:  none Anticipated MOD: Dr Feliberto GottronSchermerhorn aware of pt pushing and fever of 101.2 Ax and will start Amp and Gent for fever. Tylenol 1 gm po given. If late decels continue, will call Dr Feliberto GottronSchermerhorn for intervention.   [name], Kristine Eleazer W 06/30/2014, 7:08 PM

## 2014-06-30 NOTE — Progress Notes (Signed)
Patient ID: [name], female   DOB: Aug 10, 1985, 29 y.o.   MRN: [id number]  Late entry: Pt was having late decels with every UC so called Dr Feliberto GottronSchermerhorn, cut Pitocin off and O2 in use, positioned for maximum FHR, Terb x 1 given. Advised pt that she may need intervention such as forceps, vacuum or LTCS. Variability is now minimal.

## 2014-06-30 NOTE — Anesthesia Preprocedure Evaluation (Signed)
Anesthesia Evaluation  Patient identified by MRN, date of birth, ID band Patient awake    Reviewed: Allergy & Precautions, H&P , NPO status , Patient's Chart, lab work & pertinent test results  Airway Mallampati: I  TM Distance: <3 FB     Dental no notable dental hx.    Pulmonary neg pulmonary ROS,    Pulmonary exam normal       Cardiovascular negative cardio ROS Normal cardiovascular exam    Neuro/Psych negative neurological ROS  negative psych ROS   GI/Hepatic negative GI ROS, Neg liver ROS,   Endo/Other  negative endocrine ROS  Renal/GU negative Renal ROS  negative genitourinary   Musculoskeletal   Abdominal   Peds  Hematology negative hematology ROS (+)   Anesthesia Other Findings   Reproductive/Obstetrics (+) Pregnancy                             Anesthesia Physical Anesthesia Plan  ASA: II  Anesthesia Plan: Epidural   Post-op Pain Management:    Induction:   Airway Management Planned:   Additional Equipment:   Intra-op Plan:   Post-operative Plan:   Informed Consent: I have reviewed the patients History and Physical, chart, labs and discussed the procedure including the risks, benefits and alternatives for the proposed anesthesia with the patient or authorized representative who has indicated his/her understanding and acceptance.     Plan Discussed with: Anesthesiologist  Anesthesia Plan Comments:         Anesthesia Quick Evaluation

## 2014-06-30 NOTE — OR Nursing (Signed)
Female TOB 2035 Apgars - 8 and 9

## 2014-06-30 NOTE — Progress Notes (Signed)
[name] is a 29 y.o. G1P0 at 5167w0d by LMP admitted for induction of labor due to Post dates. Pt is comfortable Subjective: No pain with epidural Objective: BP 96/56 mmHg  Pulse 94  Temp(Src) 98.2 F (36.8 C) (Oral)  Resp 18  Ht 5\' 4"  (1.626 m)  Wt 71.215 kg (157 lb)  BMI 26.94 kg/m2  SpO2 100%   Total I/O In: 1987.5 [I.V.:1987.5] Out: -   FHT:  FHR: 140, +accels, no decels bpm, variability: moderate,  accelerations:  Present,  decelerations:  Absent UC:   regular, every 3  minutes SVE:   Dilation: 8 Effacement (%): 90 Exam by:: Milon Scorearon Jones, CNM Vtx: -2 Labs: Lab Results  Component Value Date   WBC 11.3* 06/29/2014   HGB 12.2 06/29/2014   HCT 35.9 06/29/2014   MCV 91.2 06/29/2014   PLT 216 06/29/2014    Assessment / Plan: Augmentation of labor, progressing well  Labor: IUPC inserted for adequate labor management.  Preeclampsia:  none Fetal Wellbeing:  Category I Pain Control:  Epidural I/D:  n/a Anticipated MOD:  NSVD  [name], [name] 06/30/2014, 12:44 PM

## 2014-06-30 NOTE — Anesthesia Procedure Notes (Addendum)
Epidural Patient location during procedure: OB Start time: 06/30/2014 8:24 AM End time: 06/30/2014 8:45 AM  Staffing Resident/CRNA: Junious SilkNOLES, Wyeth Hoffer  Preanesthetic Checklist Completed: patient identified, surgical consent, pre-op evaluation, timeout performed, IV checked, risks and benefits discussed and monitors and equipment checked  Epidural Patient position: sitting Prep: Betadine Patient monitoring: heart rate, continuous pulse ox and blood pressure Approach: midline Location: L3-L4 Injection technique: LOR air  Needle:  Needle type: Tuohy  Needle gauge: 18 G Needle length: 9 cm Needle insertion depth: 6 cm Catheter type: closed end flexible Catheter size: 20 Guage Catheter at skin depth: 13 cm Test dose: negative and 1.5% lidocaine with Epi 1:200 K  Assessment Sensory level: T10  Additional Notes No complications.  Pt rated pain as 2/10 after all boluses given  Performed by: Junious SilkNOLES, Amoria Mclees

## 2014-06-30 NOTE — Brief Op Note (Signed)
06/29/2014 - 06/30/2014  8:57 PM  PATIENT:  [name]  29 y.o. female  PRE-OPERATIVE DIAGNOSIS:  failure to progress  POST-OPERATIVE DIAGNOSIS:  same  PROCEDURE:  Procedure(s): CESAREAN SECTION (N/A)  SURGEON:  Surgeon(s) and Role:    Suzy Bouchard* Thomas J Schermerhorn, MD - Primary  PHYSICIAN ASSISTANT: joines, caron  cnm  ASSISTANTS: none   ANESTHESIA:   epidural, surgical dosing   EBL:600  IOF 1000 OU 100 cc  BLOOD ADMINISTERED:none  DRAINS: none   LOCAL MEDICATIONS USED:  OTHER 0.2 mg methergine   SPECIMEN:  Source of Specimen:  placenta  DISPOSITION OF SPECIMEN:  PATHOLOGY  COUNTS:  YES  TOURNIQUET:  * No tourniquets in log *  DICTATION: verbal   PLAN OF CARE: Admit to inpatient   PATIENT DISPOSITION:    Delay start of Pharmacological VTE agent (>24hrs) due to surgical blood loss or risk of bleeding: no

## 2014-06-30 NOTE — Progress Notes (Signed)
S: Resting comfortably without epidural. + CTX, no LOF, VB O: Temp:  [97.7 F (36.5 C)] 97.7 F (36.5 C) (05/25 2004) Pulse Rate:  [80-105] 101 (05/26 0213) Resp:  [16-18] 16 (05/25 2247) BP: (100-116)/(62-77) 100/62 mmHg (05/26 0213) Weight:  [71.215 kg (157 lb)] 71.215 kg (157 lb) (05/25 2054)   Gen: NAD, AAOx3      Abd: FNTTP      Ext: Non-tender, Nonedmeatous    FHT: 140 mod var + accelerations no decelerations TOCO: Q4 min SVE: 3/90/-1   A/P:  29 y.o. yo G1P0 at 3157w0d by LMP c/w 10 week US with PDIOL.   Labor: Progressing well with Cervidil. Plan for AROM p epidural, pitocin as needed  FWB: Reassuring Cat 1 tracing. EFW 7 lbs  GBS: neg   Lerone Onder MICHAEL 7:08 AM

## 2014-06-30 NOTE — Transfer of Care (Signed)
Immediate Anesthesia Transfer of Care Note  Patient: [name]  Procedure(s) Performed: Procedure(s): CESAREAN SECTION (N/A)  Patient Location: LDR5  Anesthesia Type:Nueroaxial Epidural Bolus for Cesarean Section  Level of Consciousness: Alert, Awake, Oriented  Airway & Oxygen Therapy: Patient Spontanous Breathing  Post-op Assessment: Report given to RN  Post vital signs: Reviewed and stable  Last Vitals:  Filed Vitals:   06/30/14 2114  BP: 125/107  Pulse: 101  Temp: 36.9 C  Resp: 12    Complications: No apparent anesthesia complications

## 2014-06-30 NOTE — Progress Notes (Signed)
Patient ID: [name], female   DOB: 06/05/1985, 29 y.o.   MRN: [id number]  CTSP for recurrent late decels. Pushing now 2 hrs . + meconium and now 101.2 axillary . Called chorioamnionitis .  Cx : c/c+1+2 with push . ++ narrow arch.  A: nonreassurring fetal monitoring , chorio , meconium  Little progression of head by CNM eval  P: recommend Primary LTCS  Risks reviewed with pt . All questions answered.  Will add Gentamycin 5.1 mg / kg post delivery and will add clindamycin  2 gm ancef for prophylaxis for sx .

## 2014-06-30 NOTE — Progress Notes (Signed)
  Subjective: Crying out in pain. Anesthesia here for epidural  Objective: BP 100/62 mmHg  Pulse 101  Temp(Src) 97.7 F (36.5 C) (Oral)  Resp 16  Ht 5\' 4"  (1.626 m)  Wt 71.215 kg (157 lb)  BMI 26.94 kg/m2      FHT:  FHR: mod variability, Cat I, no decels bpm, variability: moderate,  accelerations:  Present,  decelerations:  Absent UC:   q 3 mins, strong to palp.  SVE:   Dilation: 8 Effacement (%): 90 Exam by:: K. Yates  Labs: Lab Results  Component Value Date   WBC 11.3* 06/29/2014   HGB 12.2 06/29/2014   HCT 35.9 06/29/2014   MCV 91.2 06/29/2014   PLT 216 06/29/2014   Anesthesia is here and performing epidural now. When pt got back to bed, clear fluid leaking out of vagina so SROM noted.   Assessment / Plan: Spontaneous labor, progressing normally  Labor: Progressing normally Preeclampsia:  none Fetal Wellbeing:  Category I Pain Control:  Stadol and epidral  I/D:  n/a Anticipated MOD:  NSVD  [name], CARON W 06/30/2014, 8:27 AM

## 2014-06-30 NOTE — Progress Notes (Signed)
[name] is a 29 y.o. G1P0 at 1138w0d by LMP admitted for induction of labor due to Post dates.Pt is resting comfortably and laboring down.  Subjective: No pain  Objective: BP 105/64 mmHg  Pulse 98  Temp(Src) 98.2 F (36.8 C) (Oral)  Resp 18  Ht 5\' 4"  (1.626 m)  Wt 71.215 kg (157 lb)  BMI 26.94 kg/m2  SpO2 100%   Total I/O In: 1987.5 [I.V.:1987.5] Out: -   FHT:  FHR: 145, +accels, occas decel 130 bpm, variability: moderate,  accelerations:  Present,  decelerations:  Present Cat 2 due to small variables with mod variability UC:   regular, every 2.5  minutes SVE:   Dilation: 8 Effacement (%): 90 Exam by:: [name], CNM  Labs: Lab Results  Component Value Date   WBC 11.3* 06/29/2014   HGB 12.2 06/29/2014   HCT 35.9 06/29/2014   MCV 91.2 06/29/2014   PLT 216 06/29/2014    Assessment / Plan: Induction of labor due to postdates,  progressing well on pitocin Cx: C/C/vtx0. Labor: Progressing on Pitocin, will continue to increase then AROM and Progressing on Pitocin Preeclampsia:  labs stable Fetal Wellbeing:  Category I Pain Control:  Epidural I/D:  n/a Anticipated MOD:  NSVD  [name], [name] 06/30/2014, 2:45 PM

## 2014-07-01 ENCOUNTER — Telehealth: Payer: Self-pay

## 2014-07-01 DIAGNOSIS — Z9889 Other specified postprocedural states: Secondary | ICD-10-CM

## 2014-07-01 LAB — CBC
HCT: 31.2 % — ABNORMAL LOW (ref 35.0–47.0)
Hemoglobin: 10.3 g/dL — ABNORMAL LOW (ref 12.0–16.0)
MCH: 30.5 pg (ref 26.0–34.0)
MCHC: 33.1 g/dL (ref 32.0–36.0)
MCV: 92.1 fL (ref 80.0–100.0)
Platelets: 156 10*3/uL (ref 150–440)
RBC: 3.39 MIL/uL — AB (ref 3.80–5.20)
RDW: 13.4 % (ref 11.5–14.5)
WBC: 19.6 10*3/uL — AB (ref 3.6–11.0)

## 2014-07-01 LAB — RPR: RPR Ser Ql: NONREACTIVE

## 2014-07-01 MED ORDER — GENTAMICIN SULFATE 40 MG/ML IJ SOLN
5.0000 mg/kg | INTRAVENOUS | Status: DC
  Administered 2014-07-01 – 2014-07-02 (×2): 360 mg via INTRAVENOUS
  Filled 2014-07-01 (×4): qty 9

## 2014-07-01 MED ORDER — WITCH HAZEL-GLYCERIN EX PADS: 1.0000 "application " | MEDICATED_PAD | CUTANEOUS | Status: DC | PRN

## 2014-07-01 MED ORDER — OXYCODONE-ACETAMINOPHEN 5-325 MG PO TABS
1.0000 | ORAL_TABLET | ORAL | Status: DC | PRN
  Administered 2014-07-01 – 2014-07-03 (×13): 1 via ORAL
  Filled 2014-07-01 (×12): qty 1

## 2014-07-01 MED ORDER — TETANUS-DIPHTH-ACELL PERTUSSIS 5-2.5-18.5 LF-MCG/0.5 IM SUSP: 0.5000 mL | Freq: Once | INTRAMUSCULAR | Status: DC

## 2014-07-01 MED ORDER — SIMETHICONE 80 MG PO CHEW
80.0000 mg | CHEWABLE_TABLET | Freq: Three times a day (TID) | ORAL | Status: DC
  Administered 2014-07-01 – 2014-07-03 (×9): 80 mg via ORAL
  Filled 2014-07-01 (×9): qty 1

## 2014-07-01 MED ORDER — LACTATED RINGERS IV SOLN: INTRAVENOUS | Status: DC

## 2014-07-01 MED ORDER — SIMETHICONE 80 MG PO CHEW: 80.0000 mg | CHEWABLE_TABLET | ORAL | Status: DC | PRN

## 2014-07-01 MED ORDER — DIPHENHYDRAMINE HCL 25 MG PO CAPS: 25.0000 mg | ORAL_CAPSULE | Freq: Four times a day (QID) | ORAL | Status: DC | PRN

## 2014-07-01 MED ORDER — OXYCODONE-ACETAMINOPHEN 5-325 MG PO TABS
2.0000 | ORAL_TABLET | ORAL | Status: DC | PRN
  Filled 2014-07-01: qty 2

## 2014-07-01 MED ORDER — OXYTOCIN 40 UNITS IN LACTATED RINGERS INFUSION - SIMPLE MED
62.5000 mL/h | INTRAVENOUS | Status: DC
  Administered 2014-07-01: 62.5 mL/h via INTRAVENOUS
  Filled 2014-07-01: qty 1000

## 2014-07-01 MED ORDER — DIBUCAINE 1 % RE OINT: 1.0000 "application " | TOPICAL_OINTMENT | RECTAL | Status: DC | PRN

## 2014-07-01 MED ORDER — SIMETHICONE 80 MG PO CHEW
80.0000 mg | CHEWABLE_TABLET | ORAL | Status: DC
  Administered 2014-07-02 – 2014-07-03 (×2): 80 mg via ORAL
  Filled 2014-07-01 (×2): qty 1

## 2014-07-01 MED ORDER — CLINDAMYCIN PHOSPHATE 900 MG/50ML IV SOLN
900.0000 mg | Freq: Three times a day (TID) | INTRAVENOUS | Status: DC
  Administered 2014-07-01 – 2014-07-02 (×5): 900 mg via INTRAVENOUS
  Filled 2014-07-01 (×11): qty 50

## 2014-07-01 MED ORDER — ACETAMINOPHEN 325 MG PO TABS: 650.0000 mg | ORAL_TABLET | ORAL | Status: DC | PRN

## 2014-07-01 MED ORDER — FERROUS SULFATE 325 (65 FE) MG PO TABS
325.0000 mg | ORAL_TABLET | Freq: Every day | ORAL | Status: DC
  Administered 2014-07-01 – 2014-07-03 (×3): 325 mg via ORAL
  Filled 2014-07-01 (×3): qty 1

## 2014-07-01 MED ORDER — LANOLIN HYDROUS EX OINT: 1.0000 "application " | TOPICAL_OINTMENT | CUTANEOUS | Status: DC | PRN

## 2014-07-01 MED ORDER — MENTHOL 3 MG MT LOZG
1.0000 | LOZENGE | OROMUCOSAL | Status: DC | PRN
  Filled 2014-07-01: qty 9

## 2014-07-01 MED ORDER — IBUPROFEN 600 MG PO TABS
600.0000 mg | ORAL_TABLET | Freq: Four times a day (QID) | ORAL | Status: DC
  Administered 2014-07-01 – 2014-07-03 (×7): 600 mg via ORAL
  Filled 2014-07-01: qty 1

## 2014-07-01 MED ORDER — ZOLPIDEM TARTRATE 5 MG PO TABS
5.0000 mg | ORAL_TABLET | Freq: Every evening | ORAL | Status: DC | PRN
Start: 2014-07-01 — End: 2014-07-04

## 2014-07-01 MED ORDER — SENNOSIDES-DOCUSATE SODIUM 8.6-50 MG PO TABS: 2.0000 | ORAL_TABLET | ORAL | Status: DC

## 2014-07-01 NOTE — Anesthesia Postprocedure Evaluation (Signed)
  Anesthesia Post-op Note  Patient: [name]  Procedure(s) Performed: Procedure(s): CESAREAN SECTION (N/A)  Anesthesia type:spinal  Patient location: PACU  Post pain: Pain level controlled  Post assessment: Post-op Vital signs reviewed, Patient's Cardiovascular Status Stable, Respiratory Function Stable, Patent Airway and No signs of Nausea or vomiting  Post vital signs: Reviewed and stable  Last Vitals:  Filed Vitals:   07/01/14 0437  BP: 103/59  Pulse: 88  Temp:   Resp: 20    Level of consciousness: awake, alert  and patient cooperative  Complications: No apparent anesthesia complications

## 2014-07-01 NOTE — Progress Notes (Signed)
Post Partum Day 01 Subjective: Incisional pain   Objective: Blood pressure 103/59, pulse 88, temperature 98.5 F (36.9 C), temperature source Oral, resp. rate 20, height 5\' 4"  (1.626 m), weight 71.215 kg (157 lb), SpO2 98 %, unknown if currently breastfeeding.  Physical Exam:  General: alert and cooperative Lochia: appropriate Uterine Fundus: firm Incision: covered DVT Evaluation: No evidence of DVT seen on physical exam.   Recent Labs  06/29/14 2034 07/01/14 0446  HGB 12.2 10.3*  HCT 35.9 31.2*    Assessment/Plan: Chorioamnionitis , afebrile now  Cont abx and if afebrile tomorrow d/c abx  Pain meds today  saline lock IV   LOS: 2 days   [name] 07/01/2014, 7:35 AM

## 2014-07-01 NOTE — Anesthesia Post-op Follow-up Note (Signed)
  Anesthesia Pain Follow-up Note  Patient: [name]  Day #: 10  Date of Follow-up: 07/01/2014 Time: 7:46 AM  Last Vitals:  Filed Vitals:   07/01/14 0437  BP: 103/59  Pulse: 88  Temp:   Resp: 20    Level of Consciousness: alert  Pain: none   Side Effects:None  Catheter Site Exam:clean, dry, no drainage  Plan: Catheter removed/tip intact  COOK-MARTIN,Eldrick Penick

## 2014-07-01 NOTE — Op Note (Unsigned)
[name], [name]            ACCOUNT NO.:  [id number]  MEDICAL RECORD NO.:  [id number]  LOCATION:  340A                         FACILITY:  [facility]  PHYSICIAN:  [name], MDDATE OF BIRTH:  [birth year]  DATE OF PROCEDURE:  06/30/2014 DATE OF DISCHARGE:                              OPERATIVE REPORT   PREOPERATIVE DIAGNOSIS: 1. Chorioamnionitis. 2. Meconium staining. 3. Fetal intolerance for labor.  POSTOPERATIVE DIAGNOSIS: 1. Chorioamnionitis. 2. Meconium staining. 3. Fetal intolerance for labor. 4. Cephalopelvic disproportion.  PROCEDURE PERFORMED: 1. Primary low transverse cesarean section. 2. Cephalopelvic disproportion.  SURGEON:  Kristine Corner, MD  ANESTHESIA:  Surgical dosing of epidural.  FIRST ASSISTANT:  Sharee Pimple, CNM.  INDICATIONS:  A 29 year old gravida 1, para 0.  Patient has been laboring throughout the day, progressed to complete and complete station when fetus developed recurrent late decelerations.  Patient's exam was consistent with a narrow arch, and it was thought that the patient may have cephalopelvic disproportion.  After counseling the patient, it was recommended that the patient undergo a primary low transverse cesarean section.  DESCRIPTION OF PROCEDURE:  After adequate surgical dosing of epidural, the patient received 2 g IV Ancef.  The patient's abdomen was prepped and draped in normal sterile fashion.  A Pfannenstiel incision was made 2 fingerbreadths above the symphysis pubis.  Sharp dissection was used to identify the fascia.  The fascia was opened in the midline and opened in a transverse fashion.  The superior aspect of the fascia was grasped with Kocher clamps, and the recti muscles dissected free.  The inferior aspect of the fascia was grasped with Kocher clamps, and the pyramidalis muscle was dissected free.  Entry into the peritoneal cavity was accomplished sharply.  The vesicouterine peritoneal fold was  identified, and the bladder flap was created, and the bladder was reflected inferiorly.  A low transverse uterine incision was made.  Upon entry into the endometrial cavity, thick meconium fluid was noted.  Fetal head was delivered through the incision.  Shoulder and body were delivered without difficulty.  Infant had a spontaneous cry while clamping the cord on the abdomen.  A vigorous female was passed to nursery staff who assigned Apgar scores of 8 and 9, female, weight 3400 g.  Placenta was manually delivered, and the uterus was exteriorized.  Endometrial cavity was then wiped clean with a laparotomy tape.  Intravenous Pitocin was administered.  There was some uterine atony that was noted, and the patient did receive 0.2 mg of intramuscular Methergine.  Uterine incision was closed with 1 chromic suture in a running, locking fashion. Good approximation of edges.  Good hemostasis was noted.  The fallopian tubes and ovaries appeared normal.  There were some filmy adhesions of the bowel to the posterior aspect of the uterus.  These were taken down sharply.  Abdomen was copiously irrigated, and pericolic gutters were wiped clean with laparotomy tape.  Uterus was placed back into the abdominal cavity, and the uterine incision again appeared hemostatic. The uterine incision was then covered with Interceed in a T-shaped fashion.  The fascia was then closed with a running 0 Vicryl suture. Good approximation of edges, good hemostasis was noted.  Subcutaneous tissues  were irrigated and bovied for hemostasis, and the skin was reapproximated with staples.  There were no complications.  ESTIMATED BLOOD LOSS:  600 cc.  INTRAOPERATIVE FLUIDS:  1000 cc.  URINE OUTPUT:  100 cc.  The patient tolerated the procedure well and was taken to the recovery room in good condition.          ______________________________ Kristine Cornerhomas Schermerhorn, MD     TS/MEDQ  D:  06/30/2014  T:  06/30/2014  Job:   [id number]

## 2014-07-02 LAB — CBC
HEMATOCRIT: 29.1 % — AB (ref 35.0–47.0)
Hemoglobin: 9.8 g/dL — ABNORMAL LOW (ref 12.0–16.0)
MCH: 31 pg (ref 26.0–34.0)
MCHC: 33.7 g/dL (ref 32.0–36.0)
MCV: 91.8 fL (ref 80.0–100.0)
PLATELETS: 176 10*3/uL (ref 150–440)
RBC: 3.17 MIL/uL — ABNORMAL LOW (ref 3.80–5.20)
RDW: 13.9 % (ref 11.5–14.5)
WBC: 16.3 10*3/uL — AB (ref 3.6–11.0)

## 2014-07-02 NOTE — Progress Notes (Signed)
Subjective:POD#2 Postpartum Day 2: Cesarean Delivery and chorioamnionitis Patient reports no problems voiding.    Objective: Vital signs in last 24 hours: Temp:  [97.9 F (36.6 C)-98.7 F (37.1 C)] 98.7 F (37.1 C) (05/28 0820) Pulse Rate:  [94-109] 109 (05/28 0820) Resp:  [16-17] 16 (05/28 0820) BP: (99-108)/(58-72) 108/72 mmHg (05/28 0820) SpO2:  [96 %-99 %] 99 % (05/28 0820)  Physical Exam:  General: alert and cooperative Lochia: appropriate Uterine Fundus: firm Incision: healing well DVT Evaluation: No evidence of DVT seen on physical exam.   Recent Labs  07/01/14 0446 07/02/14 0709  HGB 10.3* 9.8*  HCT 31.2* 29.1*    Assessment/Plan: Status post Cesarean section. Doing well postoperatively. , afebrile now > 24 hrs. D/c abx + IV . Pt may shower Continue current care.  Zemira Zehring 07/02/2014, 12:00 PM

## 2014-07-03 MED ORDER — MEASLES, MUMPS & RUBELLA VAC ~~LOC~~ INJ
0.5000 mL | INJECTION | Freq: Once | SUBCUTANEOUS | Status: AC
  Administered 2014-07-03: 0.5 mL via SUBCUTANEOUS
  Filled 2014-07-03: qty 0.5

## 2014-07-03 MED ORDER — LANOLIN HYDROUS EX OINT: 1.0000 "application " | TOPICAL_OINTMENT | CUTANEOUS | Status: DC | PRN

## 2014-07-03 MED ORDER — IBUPROFEN 600 MG PO TABS: 600.0000 mg | ORAL_TABLET | Freq: Four times a day (QID) | ORAL | Status: DC | PRN

## 2014-07-03 MED ORDER — SIMETHICONE 80 MG PO CHEW: 80.0000 mg | CHEWABLE_TABLET | Freq: Three times a day (TID) | ORAL | Status: DC

## 2014-07-03 MED ORDER — DOCUSATE SODIUM 100 MG PO CAPS: 100.0000 mg | ORAL_CAPSULE | Freq: Two times a day (BID) | ORAL | Status: DC | PRN

## 2014-07-03 MED ORDER — OXYCODONE-ACETAMINOPHEN 5-325 MG PO TABS: 1.0000 | ORAL_TABLET | ORAL | Status: DC | PRN

## 2014-07-03 NOTE — Progress Notes (Signed)
Prenatal records indicate that pt received TDaP vaccine on 03/30/14. Reynold BowenSusan Paisley Chavon Lucarelli, RN 07/03/2014 3:22 PM

## 2014-07-03 NOTE — Discharge Instructions (Signed)
Cesarean Delivery, Care After °Refer to this sheet in the next few weeks. These instructions provide you with information on caring for yourself after your procedure. Your health care provider may also give you specific instructions. Your treatment has been planned according to current medical practices, but problems sometimes occur. Call your health care provider if you have any problems or questions after you go home. °HOME CARE INSTRUCTIONS  °· Only take over-the-counter or prescription medications as directed by your health care provider. °· Do not drink alcohol, especially if you are breastfeeding or taking medication to relieve pain. °· Do not chew or smoke tobacco. °· Continue to use good perineal care. Good perineal care includes: °¨ Wiping your perineum from front to back. °¨ Keeping your perineum clean. °· Check your surgical cut (incision) daily for increased redness, drainage, swelling, or separation of skin. °· Clean your incision gently with soap and water every day, and then pat it dry. If your health care provider says it is okay, leave the incision uncovered. Use a bandage (dressing) if the incision is draining fluid or appears irritated. If the adhesive strips across the incision do not fall off within 7 days, carefully peel them off. °· Hug a pillow when coughing or sneezing until your incision is healed. This helps to relieve pain. °· Do not use tampons or douche until your health care provider says it is okay. °· Shower, wash your hair, and take tub baths as directed by your health care provider. °· Wear a well-fitting bra that provides breast support. °· Limit wearing support panties or control-top hose. °· Drink enough fluids to keep your urine clear or pale yellow. °· Eat high-fiber foods such as whole grain cereals and breads, brown rice, beans, and fresh fruits and vegetables every day. These foods may help prevent or relieve constipation. °· Resume activities such as climbing stairs,  driving, lifting, exercising, or traveling as directed by your health care provider. °· Talk to your health care provider about resuming sexual activities. This is dependent upon your risk of infection, your rate of healing, and your comfort and desire to resume sexual activity. °· Try to have someone help you with your household activities and your newborn for at least a few days after you leave the hospital. °· Rest as much as possible. Try to rest or take a nap when your newborn is sleeping. °· Increase your activities gradually. °· Keep all of your scheduled postpartum appointments. It is very important to keep your scheduled follow-up appointments. At these appointments, your health care provider will be checking to make sure that you are healing physically and emotionally. °SEEK MEDICAL CARE IF:  °· You are passing large clots from your vagina. Save any clots to show your health care provider. °· You have a foul smelling discharge from your vagina. °· You have trouble urinating. °· You are urinating frequently. °· You have pain when you urinate. °· You have a change in your bowel movements. °· You have increasing redness, pain, or swelling near your incision. °· You have pus draining from your incision. °· Your incision is separating. °· You have painful, hard, or reddened breasts. °· You have a severe headache. °· You have blurred vision or see spots. °· You feel sad or depressed. °· You have thoughts of hurting yourself or your newborn. °· You have questions about your care, the care of your newborn, or medications. °· You are dizzy or light-headed. °· You have a rash. °· You   have pain, redness, or swelling at the site of the removed intravenous access (IV) tube.  You have nausea or vomiting.  You stopped breastfeeding and have not had a menstrual period within 12 weeks of stopping.  You are not breastfeeding and have not had a menstrual period within 12 weeks of delivery.  You have a fever. SEEK  IMMEDIATE MEDICAL CARE IF:  You have persistent pain.  You have chest pain.  You have shortness of breath.  You faint.  You have leg pain.  You have stomach pain.  Your vaginal bleeding saturates 2 or more sanitary pads in 1 hour. MAKE SURE YOU:   Understand these instructions.  Will watch your condition.  Will get help right away if you are not doing well or get worse. Document Released: 10/13/2001 Document Revised: 06/07/2013 Document Reviewed: 09/18/2011 [facility] Patient Information 2015 [employer] [place]. This information is not intended to replace advice given to you by your health care provider. Make sure you discuss any questions you have with your health care provider.  Call your doctor for increased pain or vaginal bleeding, temperature above 100.4, depression, or concerns.  No strenuous activity or heavy lifting for 6 weeks.  No intercourse, tampons, douching, or enemas for 6 weeks.  No tub baths-showers only.  No driving for 2 weeks or while taking pain medications.  Continue prenatal vitamin and iron.  Keep incision clean and dry.  Call your doctor for incision concerns including redness, swelling, bleeding or drainage, or if begins to come apart.  Increase calories and fluids while breastfeeding.

## 2014-07-03 NOTE — Discharge Summary (Signed)
Obstetric Discharge Summary Reason for Admission: induction of labor Prenatal Procedures: NST Intrapartum Procedures: cesarean: low cervical, transverse Postpartum Procedures: Rubella Ig Complications-Operative and Postpartum: none HEMOGLOBIN  Date Value Ref Range Status  07/02/2014 9.8* 12.0 - 16.0 g/dL Final   HCT  Date Value Ref Range Status  07/02/2014 29.1* 35.0 - 47.0 % Final    Physical Exam:  General: alert, cooperative and no distress Lochia: appropriate Uterine Fundus: firm, mildly TTP which appears to be from prior exams and not infection Incision: healing well, no significant drainage, no dehiscence, no significant erythema DVT Evaluation: No evidence of DVT seen on physical exam. No cords or calf tenderness. No significant calf/ankle edema.  Discharge Diagnoses: Term Pregnancy-delivered and Failed induction  Discharge Information: Date: 07/03/2014 Activity: pelvic rest and no lifting Diet: routine Medications: PNV, Ibuprofen, Colace, Iron and Percocet Condition: stable Instructions: refer to practice specific booklet Discharge to: home Follow-up Information    Follow up with SCHERMERHORN,THOMAS, MD In 2 weeks.   Specialty:  Obstetrics and Gynecology   Why:  For postop check   Contact information:   [address] [place] [address] [phone]       Follow up with SCHERMERHORN,THOMAS, MD In 6 weeks.   Specialty:  Obstetrics and Gynecology   Why:  For postpartum visit   Contact information:   [address] [place] [address] [phone]       Newborn Data: Live born female  Birth Weight: 7 lb 7.9 oz (3400 g) APGAR: 9, 10  Home with mother.  Christeen DouglasBEASLEY, Theordore Cisnero 07/03/2014, 12:41 PM

## 2014-07-03 NOTE — Progress Notes (Signed)
Pt discharged at this time via wheelchair escorted by RN; pt going home with husband, family and new baby

## 2014-07-05 LAB — SURGICAL PATHOLOGY

## 2014-07-08 NOTE — Addendum Note (Signed)
Addendum  created 07/08/14 1152 by Sherron Flemingsustin Malcolm Quast, CRNA   Modules edited: Anesthesia Events, Narrator   Narrator:  Narrator: Event Log Edited

## 2014-07-18 ENCOUNTER — Encounter: Payer: Self-pay | Admitting: Obstetrics and Gynecology

## 2014-10-10 ENCOUNTER — Encounter: Payer: Self-pay | Admitting: Internal Medicine

## 2016-10-31 NOTE — H&P (Signed)
Chief Complaint:    [name] is a 31 y.o. female here for Discuss sono . Pt with Missed ab noted on yesterday u/ s. Today with repeat and now FHR . Marland Kitchen Fetal pole c/w 8+5 weeks. Pt denies bleeding , cramping  Blood type AB+   Past Medical History:  has a past medical history of POTS (postural orthostatic tachycardia syndrome), unspecified.  Past Surgical History:  has a past surgical history that includes wisdom teeth extraction; Oral Surgery Operative Note; Colposcopy; and Cesarean section. Family History: family history includes Cervical cancer in her other; No Known Problems in her mother; Ovarian cancer in her other; Prostate cancer in her father; Thyroid disease in her father. Social History:  reports that she has never smoked. She has never used smokeless tobacco. She reports that she does not drink alcohol or use drugs. OB/GYN History:          OB History    Gravida Para Term Preterm AB Living   SAB TAB Ectopic Molar Multiple Live Births             1      Allergies: has No Known Allergies. Medications:  Current Outpatient Prescriptions:  .  FERROUS SULFATE (SLOW FE ORAL), Take by mouth., Disp: , Rfl:  .  prenatal vit-iron fumarate-FA (PRENAVITE) tablet, Take 1 tablet by mouth once daily., Disp: , Rfl:  .  triamcinolone 0.5 % cream, Apply topically 2 (two) times daily., Disp: 30 g, Rfl: 0  Review of Systems: General:                      No fatigue or weight loss Eyes:                           No vision changes Ears:                            No hearing difficulty Respiratory:                No cough or shortness of breath Pulmonary:                  No asthma or shortness of breath Cardiovascular:           No chest pain, palpitations, dyspnea on exertion Gastrointestinal:          No abdominal bloating, chronic diarrhea, constipations, masses, pain or hematochezia Genitourinary:             No hematuria, dysuria, abnormal vaginal  discharge, pelvic pain, Menometrorrhagia Lymphatic:                   No swollen lymph nodes Musculoskeletal:         No muscle weakness Neurologic:                  No extremity weakness, syncope, seizure disorder Psychiatric:                  No history of depression, delusions or suicidal/homicidal ideation    Exam:      Vitals:   10/31/16 1002  BP: 102/71  Pulse: 96    Body mass index is 21.47 kg/m.  WDWN white/ female in NAD   Lungs: CTA  CV : RRR without murmur   Breast: exam done in sitting  and lying position : No dimpling or retraction, no dominant mass, no spontaneous discharge, no axillary adenopathy Neck:  no thyromegaly Abdomen: soft , no mass, normal active bowel sounds,  non-tender, no rebound tenderness   Impression:   The encounter diagnosis was Missed abortion.  8+5 weeks   Plan:  Options offered expectant management  Vs cytotec vs D+C . She opts for latter     Benefits and risks to surgery: The proposed benefit of the surgery has been discussed with the patient. The possible risks include, but are not limited to: organ injury to the bowel , bladder, ureters, and major blood vessels and nerves. There is a possibility of additional surgeries resulting from these injuries. There is also the risk of blood transfusion and the need to receive blood products during or after the procedure which may rarely lead to HIV or Hepatitis C infection. There is a risk of developing a deep venous thrombosis or a pulmonary embolism . There is the possibility of wound infection and also anesthetic complications, even the rare possibility of death. The patient understands these risks and wishes to proceed. All questions have been answered and the consent has been signed.     Return if symptoms worsen or fail to improve.  Kristine Prader, MD

## 2016-11-03 MED ORDER — CEFOXITIN SODIUM-DEXTROSE 2-2.2 GM-% IV SOLR (PREMIX)
2.0000 g | INTRAVENOUS | Status: AC
  Administered 2016-11-04: 2 g via INTRAVENOUS

## 2016-11-04 ENCOUNTER — Ambulatory Visit
Admission: RE | Admit: 2016-11-04 | Discharge: 2016-11-04 | Disposition: A | Discharge status: Home / Self Care | Payer: BLUE CROSS/BLUE SHIELD | Source: Ambulatory Visit | Attending: Obstetrics and Gynecology | Admitting: Obstetrics and Gynecology

## 2016-11-04 ENCOUNTER — Ambulatory Visit: Payer: BLUE CROSS/BLUE SHIELD | Admitting: Certified Registered Nurse Anesthetist

## 2016-11-04 ENCOUNTER — Encounter
Admission: RE | Disposition: A | Discharge status: Home / Self Care | Payer: Self-pay | Source: Ambulatory Visit | Attending: Obstetrics and Gynecology

## 2016-11-04 DIAGNOSIS — Z3A08 8 weeks gestation of pregnancy: Secondary | ICD-10-CM | POA: Diagnosis not present

## 2016-11-04 DIAGNOSIS — Z8041 Family history of malignant neoplasm of ovary: Secondary | ICD-10-CM | POA: Insufficient documentation

## 2016-11-04 DIAGNOSIS — Z8349 Family history of other endocrine, nutritional and metabolic diseases: Secondary | ICD-10-CM | POA: Insufficient documentation

## 2016-11-04 DIAGNOSIS — Z87898 Personal history of other specified conditions: Secondary | ICD-10-CM | POA: Diagnosis not present

## 2016-11-04 DIAGNOSIS — Z8049 Family history of malignant neoplasm of other genital organs: Secondary | ICD-10-CM | POA: Diagnosis not present

## 2016-11-04 DIAGNOSIS — O021 Missed abortion: Secondary | ICD-10-CM | POA: Diagnosis present

## 2016-11-04 DIAGNOSIS — Z8042 Family history of malignant neoplasm of prostate: Secondary | ICD-10-CM | POA: Diagnosis not present

## 2016-11-04 DIAGNOSIS — Z9889 Other specified postprocedural states: Secondary | ICD-10-CM | POA: Insufficient documentation

## 2016-11-04 HISTORY — PX: DILATION AND EVACUATION: SHX1459

## 2016-11-04 LAB — CBC
HCT: 31.7 % — ABNORMAL LOW (ref 35.0–47.0)
HEMOGLOBIN: 11.1 g/dL — AB (ref 12.0–16.0)
MCH: 30.4 pg (ref 26.0–34.0)
MCHC: 35.2 g/dL (ref 32.0–36.0)
MCV: 86.4 fL (ref 80.0–100.0)
Platelets: 246 10*3/uL (ref 150–440)
RBC: 3.66 MIL/uL — AB (ref 3.80–5.20)
RDW: 12.5 % (ref 11.5–14.5)
WBC: 6.1 10*3/uL (ref 3.6–11.0)

## 2016-11-04 LAB — BASIC METABOLIC PANEL
Anion gap: 9 (ref 5–15)
BUN: 10 mg/dL (ref 6–20)
CHLORIDE: 105 mmol/L (ref 101–111)
CO2: 23 mmol/L (ref 22–32)
CREATININE: 0.65 mg/dL (ref 0.44–1.00)
Calcium: 9.5 mg/dL (ref 8.9–10.3)
GFR calc Af Amer: >60 mL/min (ref >60)
GFR calc non Af Amer: >60 mL/min (ref >60)
GLUCOSE: 94 mg/dL (ref 65–99)
POTASSIUM: 3.7 mmol/L (ref 3.5–5.1)
SODIUM: 137 mmol/L (ref 135–145)

## 2016-11-04 LAB — TYPE AND SCREEN
ABO/RH(D): AB POS
Antibody Screen: NEGATIVE

## 2016-11-04 SURGERY — DILATION AND EVACUATION, UTERUS
Anesthesia: General | Site: Uterus | Wound class: Clean Contaminated

## 2016-11-04 MED ORDER — FENTANYL CITRATE (PF) 100 MCG/2ML IJ SOLN: 25.0000 ug | INTRAMUSCULAR | Status: DC | PRN

## 2016-11-04 MED ORDER — PROPOFOL 10 MG/ML IV BOLUS
INTRAVENOUS | Status: DC | PRN
  Administered 2016-11-04: 200 mg via INTRAVENOUS

## 2016-11-04 MED ORDER — LACTATED RINGERS IV SOLN
INTRAVENOUS | Status: DC
  Administered 2016-11-04: 12:00:00 via INTRAVENOUS

## 2016-11-04 MED ORDER — LIDOCAINE HCL (CARDIAC) 20 MG/ML IV SOLN
INTRAVENOUS | Status: DC | PRN
  Administered 2016-11-04: 40 mg via INTRAVENOUS

## 2016-11-04 MED ORDER — PROMETHAZINE HCL 25 MG/ML IJ SOLN: 6.2500 mg | INTRAMUSCULAR | Status: DC | PRN

## 2016-11-04 MED ORDER — KETOROLAC TROMETHAMINE 30 MG/ML IJ SOLN
INTRAMUSCULAR | Status: DC | PRN
  Administered 2016-11-04: 30 mg via INTRAVENOUS

## 2016-11-04 MED ORDER — CEFOXITIN SODIUM-DEXTROSE 2-2.2 GM-% IV SOLR (PREMIX)
INTRAVENOUS | Status: AC
  Filled 2016-11-04: qty 50

## 2016-11-04 MED ORDER — ONDANSETRON HCL 4 MG/2ML IJ SOLN
INTRAMUSCULAR | Status: DC | PRN
  Administered 2016-11-04: 4 mg via INTRAVENOUS

## 2016-11-04 MED ORDER — PROPOFOL 10 MG/ML IV BOLUS
INTRAVENOUS | Status: AC
  Filled 2016-11-04: qty 20

## 2016-11-04 MED ORDER — MIDAZOLAM HCL 2 MG/2ML IJ SOLN
INTRAMUSCULAR | Status: DC | PRN
  Administered 2016-11-04: 2 mg via INTRAVENOUS

## 2016-11-04 MED ORDER — LIDOCAINE HCL (PF) 2 % IJ SOLN
INTRAMUSCULAR | Status: AC
  Filled 2016-11-04: qty 2

## 2016-11-04 MED ORDER — MIDAZOLAM HCL 2 MG/2ML IJ SOLN
INTRAMUSCULAR | Status: AC
  Filled 2016-11-04: qty 2

## 2016-11-04 MED ORDER — FENTANYL CITRATE (PF) 100 MCG/2ML IJ SOLN
INTRAMUSCULAR | Status: DC | PRN
  Administered 2016-11-04 (×2): 25 ug via INTRAVENOUS
  Administered 2016-11-04: 50 ug via INTRAVENOUS

## 2016-11-04 MED ORDER — SILVER NITRATE-POT NITRATE 75-25 % EX MISC
CUTANEOUS | Status: DC | PRN
  Administered 2016-11-04: 2

## 2016-11-04 MED ORDER — FENTANYL CITRATE (PF) 100 MCG/2ML IJ SOLN
INTRAMUSCULAR | Status: AC
  Filled 2016-11-04: qty 2

## 2016-11-04 MED ORDER — DEXAMETHASONE SODIUM PHOSPHATE 10 MG/ML IJ SOLN
INTRAMUSCULAR | Status: DC | PRN
  Administered 2016-11-04: 10 mg via INTRAVENOUS

## 2016-11-04 SURGICAL SUPPLY — 20 items
CATH ROBINSON RED A/P 16FR (CATHETERS) ×3 IMPLANT
FILTER UTR ASPR SPEC (MISCELLANEOUS) ×1 IMPLANT
FLTR UTR ASPR SPEC (MISCELLANEOUS) ×3
GLOVE BIO SURGEON STRL SZ8 (GLOVE) ×3 IMPLANT
GOWN STRL REUS W/ TWL LRG LVL3 (GOWN DISPOSABLE) ×1 IMPLANT
GOWN STRL REUS W/ TWL XL LVL3 (GOWN DISPOSABLE) ×1 IMPLANT
GOWN STRL REUS W/TWL LRG LVL3 (GOWN DISPOSABLE) ×2
GOWN STRL REUS W/TWL XL LVL3 (GOWN DISPOSABLE) ×2
KIT RM TURNOVER CYSTO AR (KITS) ×3 IMPLANT
PACK DNC HYST (MISCELLANEOUS) ×3 IMPLANT
PAD OB MATERNITY 4.3X12.25 (PERSONAL CARE ITEMS) ×3 IMPLANT
PAD PREP 24X41 OB/GYN DISP (PERSONAL CARE ITEMS) ×3 IMPLANT
SET BERKELEY SUCTION TUBING (SUCTIONS) ×3 IMPLANT
TOWEL OR 17X26 4PK STRL BLUE (TOWEL DISPOSABLE) ×3 IMPLANT
VACURETTE 10 RIGID CVD (CANNULA) ×3 IMPLANT
VACURETTE 6 ASPIR F TIP BERK (CANNULA) ×3 IMPLANT
VACURETTE 7MM F TIP (CANNULA) ×2
VACURETTE 7MM F TIP STRL (CANNULA) ×1 IMPLANT
VACURETTE 8 RIGID CVD (CANNULA) ×3 IMPLANT
VACURETTE 8MM F TIP (MISCELLANEOUS) ×6 IMPLANT

## 2016-11-04 NOTE — Progress Notes (Signed)
Ready for Suction D+C  NPO  Labs reviewed

## 2016-11-04 NOTE — Anesthesia Post-op Follow-up Note (Signed)
Anesthesia QCDR form completed.        

## 2016-11-04 NOTE — Anesthesia Preprocedure Evaluation (Signed)
Anesthesia Evaluation  Patient identified by MRN, date of birth, ID band Patient awake    Reviewed: Allergy & Precautions, H&P , NPO status , Patient's Chart, lab work & pertinent test results, reviewed documented beta blocker date and time   History of Anesthesia Complications Negative for: history of anesthetic complications  Airway Mallampati: I  TM Distance: >3 FB Neck ROM: full    Dental  (+) Dental Advidsory Given, Teeth Intact   Pulmonary neg shortness of breath, neg sleep apnea, Recent URI ,           Cardiovascular Exercise Tolerance: Good (-) hypertension(-) angina(-) CAD, (-) Past MI, (-) Cardiac Stents and (-) CABG + dysrhythmias (POTS) + Valvular Problems/Murmurs      Neuro/Psych negative neurological ROS  negative psych ROS   GI/Hepatic negative GI ROS, Neg liver ROS,   Endo/Other  negative endocrine ROS  Renal/GU negative Renal ROS  negative genitourinary   Musculoskeletal   Abdominal   Peds  Hematology negative hematology ROS (+)   Anesthesia Other Findings Past Medical History: No date: Orthostatic lightheadedness No date: POTS (postural orthostatic tachycardia syndrome) No date: Tachycardia No date: Wisdom teeth extracted   Reproductive/Obstetrics negative OB ROS                             Anesthesia Physical Anesthesia Plan  ASA: I  Anesthesia Plan: General   Post-op Pain Management:    Induction: Intravenous  PONV Risk Score and Plan: 3 and Ondansetron, Dexamethasone and Midazolam  Airway Management Planned: LMA  Additional Equipment:   Intra-op Plan:   Post-operative Plan: Extubation in OR  Informed Consent: I have reviewed the patients History and Physical, chart, labs and discussed the procedure including the risks, benefits and alternatives for the proposed anesthesia with the patient or authorized representative who has indicated his/her  understanding and acceptance.   Dental Advisory Given  Plan Discussed with: Anesthesiologist, CRNA and Surgeon  Anesthesia Plan Comments:         Anesthesia Quick Evaluation

## 2016-11-04 NOTE — Anesthesia Procedure Notes (Signed)
Procedure Name: LMA Insertion Performed by: Lucila Klecka Pre-anesthesia Checklist: Patient identified, Patient being monitored, Timeout performed, Emergency Drugs available and Suction available Patient Re-evaluated:Patient Re-evaluated prior to induction Oxygen Delivery Method: Circle system utilized Preoxygenation: Pre-oxygenation with 100% oxygen Induction Type: IV induction Ventilation: Mask ventilation without difficulty LMA: LMA inserted LMA Size: 4.0 Tube type: Oral Number of attempts: 1 Placement Confirmation: positive ETCO2 and breath sounds checked- equal and bilateral Tube secured with: Tape Dental Injury: Teeth and Oropharynx as per pre-operative assessment        

## 2016-11-04 NOTE — Transfer of Care (Signed)
Immediate Anesthesia Transfer of Care Note  Patient: [name]  Procedure(s) Performed: DILATATION AND EVACUATION (N/A Uterus)  Patient Location: PACU  Anesthesia Type:General  Level of Consciousness: sedated  Airway & Oxygen Therapy: Patient Spontanous Breathing and Patient connected to face mask oxygen  Post-op Assessment: Report given to RN and Post -op Vital signs reviewed and stable  Post vital signs: Reviewed and stable  Last Vitals:  Vitals:   11/04/16 1132  BP: 111/78  Pulse: 98  Resp: 16  Temp: 36.8 C  SpO2: 100%    Last Pain:  Vitals:   11/04/16 1132  TempSrc: Oral         Complications: No apparent anesthesia complications

## 2016-11-04 NOTE — Discharge Instructions (Signed)
AMBULATORY SURGERY  DISCHARGE INSTRUCTIONS   1) The drugs that you were given will stay in your system until tomorrow so for the next 24 hours you should not:  A) Drive an automobile B) Make any legal decisions C) Drink any alcoholic beverage   2) You may resume regular meals tomorrow.  Today it is better to start with liquids and gradually work up to solid foods.  You may eat anything you prefer, but it is better to start with liquids, then soup and crackers, and gradually work up to solid foods.   3) Please notify your doctor immediately if you have any unusual bleeding, trouble breathing, redness and pain at the surgery site, drainage, fever, or pain not relieved by medication.    4) Additional Instructions: Call MD for heavy vaginal bleeding, fever of 100.4 or greater, foul smelling discharge coming from the vagina, burning or frequency with urination.  No sex and nothing in the vagina until after follow up appointment with MD.          Please contact your physician with any problems or Same Day Surgery at [phone], Monday through Friday 6 am to 4 pm, or [place] at [facility] number at [phone].

## 2016-11-04 NOTE — OR Nursing (Signed)
Dr Feliberto Gottron in to see patient.  Patient desires discharge.

## 2016-11-04 NOTE — Brief Op Note (Signed)
11/04/2016  1:44 PM  PATIENT:  Jeannett Senior  31 y.o. female  PRE-OPERATIVE DIAGNOSIS:  Missed AB  POST-OPERATIVE DIAGNOSIS:  Missed AB  PROCEDURE:  Procedure(s): DILATATION AND EVACUATION (N/A)  SURGEON:  Surgeon(s) and Role:    * Schermerhorn, Ihor Austin, MD - Primary  PHYSICIAN ASSISTANT: Corwin Levins , PA student   ASSISTANTS: none   ANESTHESIA:   general  EBL:  Total I/O In: 500 [I.V.:500] Out: 150 [Urine:50; Blood:100]  BLOOD ADMINISTERED:none  DRAINS: none   LOCAL MEDICATIONS USED:  NONE  SPECIMEN:  Source of Specimen:  products of conception   DISPOSITION OF SPECIMEN:  PATHOLOGY  COUNTS:  YES  TOURNIQUET:  * No tourniquets in log *  DICTATION: .Other Dictation: Dictation Number verbal  PLAN OF CARE: Discharge to home after PACU  PATIENT DISPOSITION:  PACU - hemodynamically stable.   Delay start of Pharmacological VTE agent (>24hrs) due to surgical blood loss or risk of bleeding: not applicable

## 2016-11-05 ENCOUNTER — Encounter: Payer: Self-pay | Admitting: Obstetrics and Gynecology

## 2016-11-05 LAB — SURGICAL PATHOLOGY

## 2016-11-05 NOTE — Anesthesia Postprocedure Evaluation (Signed)
Anesthesia Post Note  Patient: [name]  Procedure(s) Performed: DILATATION AND EVACUATION (N/A Uterus)  Patient location during evaluation: PACU Anesthesia Type: General Level of consciousness: awake and alert Pain management: pain level controlled Vital Signs Assessment: post-procedure vital signs reviewed and stable Respiratory status: spontaneous breathing, nonlabored ventilation, respiratory function stable and patient connected to nasal cannula oxygen Cardiovascular status: blood pressure returned to baseline and stable Postop Assessment: no apparent nausea or vomiting Anesthetic complications: no     Last Vitals:  Vitals:   11/04/16 1437 11/04/16 1500  BP: 99/63 96/64  Pulse: 98 75  Resp: 16 16  Temp: 36.4 C 36.7 C  SpO2: 98% 100%    Last Pain:  Vitals:   11/04/16 1500  TempSrc: Temporal                 Lenard Simmer

## 2016-11-05 NOTE — Op Note (Signed)
[name], [name]            ACCOUNT NO.:  [id number]  MEDICAL RECORD NO.:  [id number]  LOCATION:                                 FACILITY:  PHYSICIAN:  Jennell Corner, MD     DATE OF BIRTH:  DATE OF PROCEDURE:  11/04/2016 DATE OF DISCHARGE:                              OPERATIVE REPORT   PREOPERATIVE DIAGNOSIS:  Missed abortion.  POSTOPERATIVE DIAGNOSIS:  Missed abortion.  PROCEDURE PERFORMED:  Suction dilation and curettage.  SURGEON:  Jennell Corner, MD  ANESTHESIA:  General.  FIRST ASSISTANT:  Corwin Levins, PA student.  INDICATIONS:  A 31 year old gravida 2, para 1 patient at 8+5 weeks with fetal demise that was documented by ultrasound repeated the next day. Blood type AB positive.  DESCRIPTION OF PROCEDURE:  Time-out was performed.  After adequate general endotracheal anesthesia, the patient was placed in dorsal supine position.  Lower perineum, vagina prepped and draped in normal sterile fashion.  Straight catheterization of the bladder yielded 50 mL clear urine.  Weighted speculum was placed in the posterior vaginal vault. Anterior cervix was grasped with single-tooth tenaculum.  Cervix was dilated #20 Hanks dilator without difficulty.  #8 flexible suction curette was placed in the endometrial cavity and large amount of tissue removed.  Sharp curettage was performed with no additional tissue, good uterine cry, and a repeat suction curettage performed with no additional tissue and good hemostasis.  There were no complications.  ESTIMATED BLOOD LOSS:  100 mL.  INTRAOPERATIVE FLUIDS:  500 mL.  URINE OUTPUT:  50 mL.  The patient tolerated procedure well and was taken to recovery room in good condition.    ______________________________ Jennell Corner, MD   ______________________________ Jennell Corner, MD    TS/MEDQ  D:  11/04/2016  T:  11/04/2016  Job:  [id number]

## 2017-06-25 LAB — OB RESULTS CONSOLE VARICELLA ZOSTER ANTIBODY, IGG: Varicella: IMMUNE

## 2017-06-25 LAB — OB RESULTS CONSOLE HEPATITIS B SURFACE ANTIGEN: Hepatitis B Surface Ag: NEGATIVE

## 2017-06-25 LAB — OB RESULTS CONSOLE RUBELLA ANTIBODY, IGM: Rubella: IMMUNE

## 2017-06-27 ENCOUNTER — Other Ambulatory Visit: Payer: Self-pay | Admitting: Obstetrics and Gynecology

## 2017-06-27 DIAGNOSIS — Z369 Encounter for antenatal screening, unspecified: Secondary | ICD-10-CM

## 2017-07-07 ENCOUNTER — Ambulatory Visit
Admission: RE | Admit: 2017-07-07 | Discharge: 2017-07-07 | Disposition: A | Discharge status: Home / Self Care | Payer: BLUE CROSS/BLUE SHIELD | Source: Ambulatory Visit | Attending: Obstetrics and Gynecology | Admitting: Obstetrics and Gynecology

## 2017-07-07 ENCOUNTER — Encounter: Payer: Self-pay | Admitting: *Deleted

## 2017-07-07 VITALS — BP 110/69 | HR 105 | Temp 97.7°F | Resp 18 | Ht 65.0 in | Wt 140.4 lb

## 2017-07-07 DIAGNOSIS — Z369 Encounter for antenatal screening, unspecified: Secondary | ICD-10-CM

## 2017-07-07 DIAGNOSIS — Z363 Encounter for antenatal screening for malformations: Secondary | ICD-10-CM | POA: Diagnosis not present

## 2017-07-07 DIAGNOSIS — Z3682 Encounter for antenatal screening for nuchal translucency: Secondary | ICD-10-CM | POA: Insufficient documentation

## 2017-07-07 DIAGNOSIS — Z8249 Family history of ischemic heart disease and other diseases of the circulatory system: Secondary | ICD-10-CM

## 2017-07-07 NOTE — Progress Notes (Signed)
Referring physician:  Milon Scorearon Fry Length of Consultation: 30 minutes   [name]  was [facility] for genetic counseling to review prenatal screening and testing options.  This note summarizes the information we discussed.    We offered the following routine screening tests for this pregnancy:  First trimester screening, which includes nuchal translucency ultrasound screen and first trimester maternal serum marker screening.  The nuchal translucency has approximately an 80% detection rate for Down syndrome and can be positive for other chromosome abnormalities as well as congenital heart defects.  When combined with a maternal serum marker screening, the detection rate is up to 90% for Down syndrome and up to 97% for trisomy 18.     Maternal serum marker screening, a blood test that measures pregnancy proteins, can provide risk assessments for Down syndrome, trisomy 18, and open neural tube defects (spina bifida, anencephaly). Because it does not directly examine the fetus, it cannot positively diagnose or rule out these problems.  Targeted ultrasound uses high frequency sound waves to create an image of the developing fetus.  An ultrasound is often recommended as a routine means of evaluating the pregnancy.  It is also used to screen for fetal anatomy problems (for example, a heart defect) that might be suggestive of a chromosomal or other abnormality.   Should these screening tests indicate an increased concern, then the following additional testing options would be offered:  The chorionic villus sampling procedure is available for first trimester chromosome analysis.  This involves the withdrawal of a small amount of chorionic villi (tissue from the developing placenta).  Risk of pregnancy loss is estimated to be approximately 1 in 200 to 1 in 100 (0.5 to 1%).  There is approximately a 1% (1 in 100) chance that the CVS chromosome results will be  unclear.  Chorionic villi cannot be tested for neural tube defects.     Amniocentesis involves the removal of a small amount of amniotic fluid from the sac surrounding the fetus with the use of a thin needle inserted through the maternal abdomen and uterus.  Ultrasound guidance is used throughout the procedure.  Fetal cells from amniotic fluid are directly evaluated and > 99.5% of chromosome problems and > 98% of open neural tube defects can be detected. This procedure is generally performed after the 15th week of pregnancy.  The main risks to this procedure include complications leading to miscarriage in less than 1 in 200 cases (0.5%).  As another option for information if the pregnancy is suspected to be an an increased chance for certain chromosome conditions, we also reviewed the availability of cell free fetal DNA testing from maternal blood to determine whether or not the baby may have either Down syndrome, trisomy 4213, or trisomy 8318.  This test utilizes a maternal blood sample and DNA sequencing technology to isolate circulating cell free fetal DNA from maternal plasma.  The fetal DNA can then be analyzed for DNA sequences that are derived from the three most common chromosomes involved in aneuploidy, chromosomes 13, 18, and 21.  If the overall amount of DNA is greater than the expected level for any of these chromosomes, aneuploidy is suspected.  While we do not consider it a replacement for invasive testing and karyotype analysis, a negative result from this testing would be reassuring, though not a guarantee of a normal chromosome complement for the baby.  An abnormal result is certainly suggestive of an abnormal chromosome complement, though  we would still recommend CVS or amniocentesis to confirm any findings from this testing.  Cystic Fibrosis and Spinal Muscular Atrophy (SMA) screening were also discussed with the patient. Both conditions are recessive, which means that both parents must be  carriers in order to have a child with the disease.  Cystic fibrosis (CF) is one of the most common genetic conditions in persons of Caucasian ancestry.  This condition occurs in approximately 1 in 2,500 Caucasian persons and results in thickened secretions in the lungs, digestive, and reproductive systems.  For a baby to be at risk for having CF, both of the parents must be carriers for this condition.  Approximately 1 in 65 Caucasian persons is a carrier for CF.  Current carrier testing looks for the most common mutations in the gene for CF and can detect approximately 90% of carriers in the Caucasian population.  This means that the carrier screening can greatly reduce, but cannot eliminate, the chance for an individual to have a child with CF.  If an individual is found to be a carrier for CF, then carrier testing would be available for the partner. As part of [name]'s newborn screening profile, all babies born in the state of [place] will have a two-tier screening process.  Specimens are first tested to determine the concentration of immunoreactive trypsinogen (IRT).  The top 5% of specimens with the highest IRT values then undergo DNA testing using a panel of over 40 common CF mutations. SMA is a neurodegenerative disorder that leads to atrophy of skeletal muscle and overall weakness.  This condition is also more prevalent in the Caucasian population, with 1 in 40-1 in 60 persons being a carrier and 1 in 6,000-1 in 10,000 children being affected.  There are multiple forms of the disease, with some causing death in infancy to other forms with survival into adulthood.  The genetics of SMA is complex, but carrier screening can detect up to 95% of carriers in the Caucasian population.  Similar to CF, a negative result can greatly reduce, but cannot eliminate, the chance to have a child with SMA.  We reviewed the detailed family history and pregnancy history obtained at her last visit in 2015.  She  reported no changes in the history other than the birth of their 34 year old daughter who is in good health and an 8 week miscarriage.  [name] reported no complications or exposures in this pregnancy that would be expected to increased the risk for birth defects in this pregnancy.  After consideration of the options, [name] elected to proceed with first trimester screening and to decline CF and SMA carrier screening.  An ultrasound was performed at the time of the visit.  The gestational age was consistent with 13 weeks.  Fetal anatomy could not be assessed due to early gestational age.  Please refer to the ultrasound report for details of that study.  [name] was encouraged to call with questions or concerns.  We can be contacted at [phone].  Labs Ordered:  First trimester screening  Cherly Anderson, MS, CGC

## 2017-07-14 ENCOUNTER — Telehealth: Payer: Self-pay | Admitting: Obstetrics and Gynecology

## 2017-07-14 NOTE — Telephone Encounter (Signed)
  [name] elected to undergo First Trimester screening on 07/07/2017.  To review, first trimester screening, includes nuchal translucency ultrasound screen and/or first trimester maternal serum marker screening.  The nuchal translucency has approximately an 80% detection rate for Down syndrome and can be positive for other chromosome abnormalities as well as heart defects.  When combined with a maternal serum marker screening, the detection rate is up to 90% for Down syndrome and up to 97% for trisomy 13 and 18.     The results of the First Trimester Nuchal Translucency and Biochemical Screening were within normal range.  The risk for Down syndrome is now estimated to be less than 1 in 10,000.  The risk for Trisomy 13/18 is also estimated to be less than 1 in 10,000.  Should more definitive information be desired, we would offer amniocentesis.  Because we do not yet know the effectiveness of combined first and second trimester screening, we do not recommend a maternal serum screen to assess the chance for chromosome conditions.  However, if screening for neural tube defects is desired, maternal serum screening for AFP only can be performed between 15 and [redacted] weeks gestation.      Cherly Andersoneborah F. Yaretzi Ernandez, MS, CGC

## 2017-12-18 LAB — OB RESULTS CONSOLE GC/CHLAMYDIA
Chlamydia: NEGATIVE
Gonorrhea: NEGATIVE

## 2017-12-18 LAB — OB RESULTS CONSOLE RPR: RPR: NONREACTIVE

## 2017-12-18 LAB — OB RESULTS CONSOLE HIV ANTIBODY (ROUTINE TESTING): HIV: NONREACTIVE

## 2017-12-18 LAB — OB RESULTS CONSOLE GBS: GBS: NEGATIVE

## 2017-12-18 NOTE — H&P (Signed)
[name] is a 32 y.o. female presenting for elective repeat LTCS . EDC 01/11/18 OB History    Gravida  3   Para  1   Term  1   Preterm  0   AB  0   Living  1     SAB  0   TAB  0   Ectopic  0   Multiple      Live Births  1          Past Medical History:  Diagnosis Date  . Orthostatic lightheadedness   . POTS (postural orthostatic tachycardia syndrome)   . Tachycardia   . Wisdom teeth extracted    Past Surgical History:  Procedure Laterality Date  . CESAREAN SECTION N/A 06/30/2014   Procedure: CESAREAN SECTION;  Surgeon: Suzy Bouchardhomas J Laelle Bridgett, MD;  Location: [facility] ORS;  Service: Obstetrics;  Laterality: N/A;  . DILATION AND EVACUATION N/A 11/04/2016   Procedure: DILATATION AND EVACUATION;  Surgeon: Feliberto GottronSchermerhorn, Ihor Austinhomas J, MD;  Location: [facility] ORS;  Service: Gynecology;  Laterality: N/A;   Family History: family history is not on file. Social History:  reports that she has never smoked. She has never used smokeless tobacco. She reports that she does not drink alcohol or use drugs.     Maternal Diabetes: No Genetic Screening: Normal Maternal Ultrasounds/Referrals: Normal Fetal Ultrasounds or other Referrals:  None Maternal Substance Abuse:  No Significant Maternal Medications:  None Significant Maternal Lab Results:  None Other Comments:  None  ROS  Review of Systems: A full review of systems was performed and negative except as noted in the HPI.   Eyes: no vision change  Ears: left ear pain  Oropharynx: no sore throat  Pulmonary . No shortness of breath , no hemoptysis Cardiovascular: no chest pain , no irregular heart beat  Gastrointestinal:no blood in stool . No diarrhea, no constipation Uro gynecologic: no dysuria , no pelvic pain Neurologic : no muscle weakness, no seizure activity  Musculoskeletal: no muscular weakness History   unknown if currently breastfeeding. Exam  Lungs CTA   CV RRR Abd : gravid     Prenatal labs: ABO, Rh:   AB+ Antibody:  neg Rubella:  Imm , Var Imm RPR:   NR HBsAg:    HIV:   Neg GBS:   pending 12/18/17  Assessment/Plan: Elective repeat LTCS 01/05/18    39+2 weeks  Risks of the procedure discussed  Consents signed   Ihor Austinhomas J Temitope Flammer 12/18/2017, 4:58 PM

## 2018-01-05 ENCOUNTER — Encounter
Admission: EM | Disposition: A | Discharge status: Home / Self Care | Payer: Self-pay | Source: Home / Self Care | Attending: Obstetrics and Gynecology

## 2018-01-05 ENCOUNTER — Inpatient Hospital Stay: Payer: BLUE CROSS/BLUE SHIELD | Admitting: Anesthesiology

## 2018-01-05 ENCOUNTER — Other Ambulatory Visit: Payer: Self-pay

## 2018-01-05 ENCOUNTER — Encounter: Payer: Self-pay | Admitting: *Deleted

## 2018-01-05 ENCOUNTER — Inpatient Hospital Stay
Admission: RE | Admit: 2018-01-05 | Payer: BLUE CROSS/BLUE SHIELD | Source: Ambulatory Visit | Admitting: Obstetrics and Gynecology

## 2018-01-05 ENCOUNTER — Inpatient Hospital Stay
Admission: EM | Admit: 2018-01-05 | Discharge: 2018-01-07 | DRG: 787 | Disposition: A | Discharge status: Home / Self Care | Payer: BLUE CROSS/BLUE SHIELD | Attending: Obstetrics and Gynecology | Admitting: Obstetrics and Gynecology

## 2018-01-05 DIAGNOSIS — O9081 Anemia of the puerperium: Secondary | ICD-10-CM | POA: Diagnosis not present

## 2018-01-05 DIAGNOSIS — O34211 Maternal care for low transverse scar from previous cesarean delivery: Principal | ICD-10-CM | POA: Diagnosis present

## 2018-01-05 DIAGNOSIS — Z3A39 39 weeks gestation of pregnancy: Secondary | ICD-10-CM

## 2018-01-05 DIAGNOSIS — O34219 Maternal care for unspecified type scar from previous cesarean delivery: Secondary | ICD-10-CM | POA: Diagnosis present

## 2018-01-05 DIAGNOSIS — D62 Acute posthemorrhagic anemia: Secondary | ICD-10-CM | POA: Diagnosis not present

## 2018-01-05 DIAGNOSIS — Z9889 Other specified postprocedural states: Secondary | ICD-10-CM

## 2018-01-05 LAB — BASIC METABOLIC PANEL
Anion gap: 10 (ref 5–15)
BUN: 13 mg/dL (ref 6–20)
CO2: 20 mmol/L — ABNORMAL LOW (ref 22–32)
Calcium: 8.8 mg/dL — ABNORMAL LOW (ref 8.9–10.3)
Chloride: 107 mmol/L (ref 98–111)
Creatinine, Ser: 0.52 mg/dL (ref 0.44–1.00)
GFR calc Af Amer: >60 mL/min (ref >60)
Glucose, Bld: 80 mg/dL (ref 70–99)
Potassium: 3.6 mmol/L (ref 3.5–5.1)
Sodium: 137 mmol/L (ref 135–145)

## 2018-01-05 LAB — CBC
HCT: 32.8 % — ABNORMAL LOW (ref 36.0–46.0)
Hemoglobin: 10.9 g/dL — ABNORMAL LOW (ref 12.0–15.0)
MCH: 30.6 pg (ref 26.0–34.0)
MCHC: 33.2 g/dL (ref 30.0–36.0)
MCV: 92.1 fL (ref 80.0–100.0)
Platelets: 199 10*3/uL (ref 150–400)
RBC: 3.56 MIL/uL — ABNORMAL LOW (ref 3.87–5.11)
RDW: 13.7 % (ref 11.5–15.5)
WBC: 9.7 10*3/uL (ref 4.0–10.5)
nRBC: 0 % (ref 0.0–0.2)

## 2018-01-05 LAB — TYPE AND SCREEN
ABO/RH(D): AB POS
Antibody Screen: NEGATIVE

## 2018-01-05 SURGERY — Surgical Case
Anesthesia: Spinal

## 2018-01-05 MED ORDER — MENTHOL 3 MG MT LOZG
1.0000 | LOZENGE | OROMUCOSAL | Status: DC | PRN
  Filled 2018-01-05: qty 9

## 2018-01-05 MED ORDER — OXYTOCIN 40 UNITS IN LACTATED RINGERS INFUSION - SIMPLE MED
INTRAVENOUS | Status: DC | PRN
  Administered 2018-01-05: 300 mL via INTRAVENOUS
  Administered 2018-01-05: 100 mL via INTRAVENOUS

## 2018-01-05 MED ORDER — KETOROLAC TROMETHAMINE 30 MG/ML IJ SOLN
30.0000 mg | Freq: Four times a day (QID) | INTRAMUSCULAR | Status: DC
  Administered 2018-01-05: 30 mg via INTRAVENOUS

## 2018-01-05 MED ORDER — OXYCODONE HCL 5 MG PO TABS: 10.0000 mg | ORAL_TABLET | ORAL | Status: DC | PRN

## 2018-01-05 MED ORDER — NALBUPHINE HCL 10 MG/ML IJ SOLN: 5.0000 mg | Freq: Once | INTRAMUSCULAR | Status: DC | PRN

## 2018-01-05 MED ORDER — SIMETHICONE 80 MG PO CHEW
80.0000 mg | CHEWABLE_TABLET | Freq: Three times a day (TID) | ORAL | Status: DC
  Administered 2018-01-05 – 2018-01-07 (×6): 80 mg via ORAL
  Filled 2018-01-05 (×7): qty 1

## 2018-01-05 MED ORDER — NALOXONE HCL 0.4 MG/ML IJ SOLN: 0.4000 mg | INTRAMUSCULAR | Status: DC | PRN

## 2018-01-05 MED ORDER — DIPHENHYDRAMINE HCL 50 MG/ML IJ SOLN: 12.5000 mg | INTRAMUSCULAR | Status: DC | PRN

## 2018-01-05 MED ORDER — ONDANSETRON HCL 4 MG/2ML IJ SOLN: 4.0000 mg | Freq: Three times a day (TID) | INTRAMUSCULAR | Status: DC | PRN

## 2018-01-05 MED ORDER — PHENYLEPHRINE HCL 10 MG/ML IJ SOLN
INTRAMUSCULAR | Status: DC | PRN
  Administered 2018-01-05 (×2): 100 ug via INTRAVENOUS

## 2018-01-05 MED ORDER — SIMETHICONE 80 MG PO CHEW
80.0000 mg | CHEWABLE_TABLET | ORAL | Status: DC
  Administered 2018-01-05 – 2018-01-06 (×2): 80 mg via ORAL
  Filled 2018-01-05 (×2): qty 1

## 2018-01-05 MED ORDER — METHYLERGONOVINE MALEATE 0.2 MG/ML IJ SOLN
INTRAMUSCULAR | Status: AC
  Filled 2018-01-05: qty 1

## 2018-01-05 MED ORDER — DIPHENHYDRAMINE HCL 25 MG PO CAPS: 25.0000 mg | ORAL_CAPSULE | ORAL | Status: DC | PRN

## 2018-01-05 MED ORDER — OXYCODONE HCL 5 MG PO TABS
5.0000 mg | ORAL_TABLET | ORAL | Status: DC | PRN
  Administered 2018-01-06 – 2018-01-07 (×3): 5 mg via ORAL
  Filled 2018-01-05 (×3): qty 1

## 2018-01-05 MED ORDER — PHENYLEPHRINE HCL 10 MG/ML IJ SOLN
INTRAMUSCULAR | Status: AC
  Filled 2018-01-05: qty 1

## 2018-01-05 MED ORDER — MEPERIDINE HCL 25 MG/ML IJ SOLN: 6.2500 mg | INTRAMUSCULAR | Status: DC | PRN

## 2018-01-05 MED ORDER — ZOLPIDEM TARTRATE 5 MG PO TABS: 5.0000 mg | ORAL_TABLET | Freq: Every evening | ORAL | Status: DC | PRN

## 2018-01-05 MED ORDER — MORPHINE SULFATE (PF) 0.5 MG/ML IJ SOLN
INTRAMUSCULAR | Status: DC | PRN
  Administered 2018-01-05: .1 mg via INTRATHECAL

## 2018-01-05 MED ORDER — TETANUS-DIPHTH-ACELL PERTUSSIS 5-2.5-18.5 LF-MCG/0.5 IM SUSP: 0.5000 mL | Freq: Once | INTRAMUSCULAR | Status: DC

## 2018-01-05 MED ORDER — FENTANYL CITRATE (PF) 100 MCG/2ML IJ SOLN
INTRAMUSCULAR | Status: DC | PRN
  Administered 2018-01-05: 15 ug via INTRATHECAL

## 2018-01-05 MED ORDER — BUPIVACAINE IN DEXTROSE 0.75-8.25 % IT SOLN
INTRATHECAL | Status: DC | PRN
  Administered 2018-01-05: 1.6 mL via INTRATHECAL

## 2018-01-05 MED ORDER — IBUPROFEN 600 MG PO TABS
600.0000 mg | ORAL_TABLET | Freq: Four times a day (QID) | ORAL | Status: DC
  Administered 2018-01-05 – 2018-01-07 (×7): 600 mg via ORAL
  Filled 2018-01-05 (×7): qty 1

## 2018-01-05 MED ORDER — LACTATED RINGERS IV SOLN
INTRAVENOUS | Status: DC
  Administered 2018-01-05: 500 mL via INTRAVENOUS

## 2018-01-05 MED ORDER — DIPHENHYDRAMINE HCL 25 MG PO CAPS: 25.0000 mg | ORAL_CAPSULE | Freq: Four times a day (QID) | ORAL | Status: DC | PRN

## 2018-01-05 MED ORDER — NALBUPHINE HCL 10 MG/ML IJ SOLN: 5.0000 mg | INTRAMUSCULAR | Status: DC | PRN

## 2018-01-05 MED ORDER — OXYCODONE HCL 5 MG PO TABS: 5.0000 mg | ORAL_TABLET | ORAL | Status: DC | PRN

## 2018-01-05 MED ORDER — SOD CITRATE-CITRIC ACID 500-334 MG/5ML PO SOLN
30.0000 mL | ORAL | Status: AC
  Administered 2018-01-05: 30 mL via ORAL
  Filled 2018-01-05: qty 15

## 2018-01-05 MED ORDER — SODIUM CHLORIDE 0.9% FLUSH: 3.0000 mL | INTRAVENOUS | Status: DC | PRN

## 2018-01-05 MED ORDER — ONDANSETRON HCL 4 MG/2ML IJ SOLN
INTRAMUSCULAR | Status: DC | PRN
  Administered 2018-01-05: 4 mg via INTRAVENOUS

## 2018-01-05 MED ORDER — DIBUCAINE 1 % RE OINT: 1.0000 "application " | TOPICAL_OINTMENT | RECTAL | Status: DC | PRN

## 2018-01-05 MED ORDER — MORPHINE SULFATE (PF) 0.5 MG/ML IJ SOLN
INTRAMUSCULAR | Status: AC
  Filled 2018-01-05: qty 10

## 2018-01-05 MED ORDER — KETOROLAC TROMETHAMINE 30 MG/ML IJ SOLN: 30.0000 mg | Freq: Four times a day (QID) | INTRAMUSCULAR | Status: DC

## 2018-01-05 MED ORDER — CEFAZOLIN SODIUM-DEXTROSE 2-4 GM/100ML-% IV SOLN
2.0000 g | INTRAVENOUS | Status: AC
  Administered 2018-01-05: 2 g via INTRAVENOUS
  Filled 2018-01-05: qty 100

## 2018-01-05 MED ORDER — WITCH HAZEL-GLYCERIN EX PADS: 1.0000 "application " | MEDICATED_PAD | CUTANEOUS | Status: DC | PRN

## 2018-01-05 MED ORDER — ACETAMINOPHEN 325 MG PO TABS
650.0000 mg | ORAL_TABLET | ORAL | Status: DC | PRN
  Administered 2018-01-06 – 2018-01-07 (×3): 650 mg via ORAL
  Filled 2018-01-05 (×3): qty 2

## 2018-01-05 MED ORDER — FENTANYL CITRATE (PF) 100 MCG/2ML IJ SOLN
INTRAMUSCULAR | Status: AC
  Filled 2018-01-05: qty 2

## 2018-01-05 MED ORDER — COCONUT OIL OIL: 1.0000 "application " | TOPICAL_OIL | Status: DC | PRN

## 2018-01-05 MED ORDER — OXYTOCIN 40 UNITS IN LACTATED RINGERS INFUSION - SIMPLE MED
INTRAVENOUS | Status: AC
  Filled 2018-01-05: qty 1000

## 2018-01-05 MED ORDER — BUPIVACAINE LIPOSOME 1.3 % IJ SUSP
20.0000 mL | Freq: Once | INTRAMUSCULAR | Status: DC
  Filled 2018-01-05: qty 20

## 2018-01-05 MED ORDER — BUPIVACAINE HCL (PF) 0.5 % IJ SOLN
INTRAMUSCULAR | Status: DC | PRN
  Administered 2018-01-05: 30 mL

## 2018-01-05 MED ORDER — KETOROLAC TROMETHAMINE 30 MG/ML IJ SOLN
INTRAMUSCULAR | Status: AC
  Filled 2018-01-05: qty 1

## 2018-01-05 MED ORDER — BUPIVACAINE LIPOSOME 1.3 % IJ SUSP: 20.0000 mL | Freq: Once | INTRAMUSCULAR | Status: DC

## 2018-01-05 MED ORDER — SENNOSIDES-DOCUSATE SODIUM 8.6-50 MG PO TABS
2.0000 | ORAL_TABLET | ORAL | Status: DC
  Administered 2018-01-05 – 2018-01-06 (×2): 2 via ORAL
  Filled 2018-01-05 (×3): qty 2

## 2018-01-05 MED ORDER — KETOROLAC TROMETHAMINE 30 MG/ML IJ SOLN: 30.0000 mg | Freq: Four times a day (QID) | INTRAMUSCULAR | Status: AC

## 2018-01-05 MED ORDER — SODIUM CHLORIDE (PF) 0.9 % IJ SOLN
INTRAMUSCULAR | Status: AC
  Filled 2018-01-05: qty 50

## 2018-01-05 MED ORDER — MIDAZOLAM HCL 2 MG/2ML IJ SOLN
INTRAMUSCULAR | Status: AC
  Filled 2018-01-05: qty 2

## 2018-01-05 MED ORDER — LACTATED RINGERS IV SOLN
INTRAVENOUS | Status: DC | PRN
  Administered 2018-01-05: 08:00:00 via INTRAVENOUS

## 2018-01-05 MED ORDER — SODIUM CHLORIDE 0.9 % IV SOLN
INTRAVENOUS | Status: DC | PRN
  Administered 2018-01-05: 70 mL

## 2018-01-05 MED ORDER — BUPIVACAINE HCL (PF) 0.5 % IJ SOLN
INTRAMUSCULAR | Status: AC
  Filled 2018-01-05: qty 30

## 2018-01-05 MED ORDER — KETOROLAC TROMETHAMINE 30 MG/ML IJ SOLN
30.0000 mg | Freq: Four times a day (QID) | INTRAMUSCULAR | Status: AC
  Administered 2018-01-05 (×2): 30 mg via INTRAVENOUS
  Filled 2018-01-05 (×2): qty 1

## 2018-01-05 MED ORDER — OXYTOCIN 40 UNITS IN LACTATED RINGERS INFUSION - SIMPLE MED
2.5000 [IU]/h | INTRAVENOUS | Status: AC
  Administered 2018-01-05 (×2): 2.5 [IU]/h via INTRAVENOUS
  Filled 2018-01-05: qty 1000

## 2018-01-05 MED ORDER — PROPOFOL 10 MG/ML IV BOLUS
INTRAVENOUS | Status: AC
  Filled 2018-01-05: qty 20

## 2018-01-05 MED ORDER — PRENATAL MULTIVITAMIN CH
1.0000 | ORAL_TABLET | Freq: Every day | ORAL | Status: DC
  Administered 2018-01-06 – 2018-01-07 (×2): 1 via ORAL
  Filled 2018-01-05 (×3): qty 1

## 2018-01-05 MED ORDER — SIMETHICONE 80 MG PO CHEW: 80.0000 mg | CHEWABLE_TABLET | ORAL | Status: DC | PRN

## 2018-01-05 MED ORDER — AMMONIA AROMATIC IN INHA
RESPIRATORY_TRACT | Status: AC
  Filled 2018-01-05: qty 10

## 2018-01-05 MED ORDER — ONDANSETRON HCL 4 MG/2ML IJ SOLN
INTRAMUSCULAR | Status: AC
  Filled 2018-01-05: qty 2

## 2018-01-05 MED ORDER — ACETAMINOPHEN 325 MG PO TABS
650.0000 mg | ORAL_TABLET | Freq: Four times a day (QID) | ORAL | Status: AC
  Administered 2018-01-05 – 2018-01-06 (×4): 650 mg via ORAL
  Filled 2018-01-05 (×4): qty 2

## 2018-01-05 SURGICAL SUPPLY — 28 items
BARRIER ADHS 3X4 INTERCEED (GAUZE/BANDAGES/DRESSINGS) ×3 IMPLANT
CANISTER SUCT 3000ML PPV (MISCELLANEOUS) ×3 IMPLANT
CHLORAPREP W/TINT 26ML (MISCELLANEOUS) ×3 IMPLANT
COVER WAND RF STERILE (DRAPES) IMPLANT
DRSG TELFA 3X8 NADH (GAUZE/BANDAGES/DRESSINGS) ×3 IMPLANT
ELECT CAUTERY BLADE 6.4 (BLADE) ×3 IMPLANT
ELECT REM PT RETURN 9FT ADLT (ELECTROSURGICAL) ×3
ELECTRODE REM PT RTRN 9FT ADLT (ELECTROSURGICAL) ×1 IMPLANT
GAUZE SPONGE 4X4 12PLY STRL (GAUZE/BANDAGES/DRESSINGS) ×3 IMPLANT
GLOVE BIO SURGEON STRL SZ8 (GLOVE) ×3 IMPLANT
GOWN STRL REUS W/ TWL LRG LVL3 (GOWN DISPOSABLE) ×2 IMPLANT
GOWN STRL REUS W/ TWL XL LVL3 (GOWN DISPOSABLE) ×1 IMPLANT
GOWN STRL REUS W/TWL LRG LVL3 (GOWN DISPOSABLE) ×4
GOWN STRL REUS W/TWL XL LVL3 (GOWN DISPOSABLE) ×2
NEEDLE HYPO 22GX1.5 SAFETY (NEEDLE) ×3 IMPLANT
NS IRRIG 1000ML POUR BTL (IV SOLUTION) ×3 IMPLANT
PACK C SECTION AR (MISCELLANEOUS) ×3 IMPLANT
PAD OB MATERNITY 4.3X12.25 (PERSONAL CARE ITEMS) ×3 IMPLANT
PAD PREP 24X41 OB/GYN DISP (PERSONAL CARE ITEMS) ×3 IMPLANT
STAPLER INSORB 30 2030 C-SECTI (MISCELLANEOUS) ×3 IMPLANT
STRAP SAFETY 5IN WIDE (MISCELLANEOUS) ×3 IMPLANT
SUCT VACUUM KIWI BELL (SUCTIONS) ×3 IMPLANT
SUT CHROMIC 1 CTX 36 (SUTURE) ×9 IMPLANT
SUT PLAIN GUT 0 (SUTURE) ×6 IMPLANT
SUT VIC AB 0 CT1 36 (SUTURE) ×6 IMPLANT
SUT VIC AB 2-0 SH 27 (SUTURE) ×2
SUT VIC AB 2-0 SH 27XBRD (SUTURE) ×1 IMPLANT
SYR 30ML LL (SYRINGE) ×6 IMPLANT

## 2018-01-05 NOTE — Anesthesia Procedure Notes (Signed)
Date/Time: 01/05/2018 7:45 AM Performed by: Henrietta HooverPope, Kaide Gage, CRNA Pre-anesthesia Checklist: Patient identified, Emergency Drugs available, Suction available, Patient being monitored and Timeout performed Patient Re-evaluated:Patient Re-evaluated prior to induction Oxygen Delivery Method: Nasal cannula Placement Confirmation: positive ETCO2

## 2018-01-05 NOTE — Progress Notes (Signed)
Immediately following IV placement at 0608 pt began feeling lightheaded and nauseas. Placed pt in supine position with IV fluids running at 9199ml/hr. VS obtain, help called. Oxygen started and ammonia under the nose used. Pt began improve after ammonia. Pt remains stable at this time with VS WNL

## 2018-01-05 NOTE — Transfer of Care (Signed)
Immediate Anesthesia Transfer of Care Note  Patient: [name]  Procedure(s) Performed: CESAREAN SECTION, repeat (N/A )  Patient Location: PACU  Anesthesia Type:General  Level of Consciousness: awake and oriented  Airway & Oxygen Therapy: Patient Spontanous Breathing  Post-op Assessment: Report given to RN and Post -op Vital signs reviewed and stable  Post vital signs: Reviewed and stable  Last Vitals:  Vitals Value Taken Time  BP 92/60 01/05/2018  9:00 AM  Temp    Pulse 82 01/05/2018  9:01 AM  Resp    SpO2 100 % 01/05/2018  9:01 AM  Vitals shown include unvalidated device data.  Last Pain:  Vitals:   01/05/18 0544  TempSrc: Oral  PainSc: 0-No pain         Complications: No apparent anesthesia complications

## 2018-01-05 NOTE — Lactation Note (Signed)
This note was copied from a baby's chart. Lactation Consultation Note  Patient Name: [name] Decou ZOXWR'UToday's Date: 01/05/2018 Reason for consult: Follow-up assessment;Other (Comment)(Mom has mal shaped, hard areola with slightly inverted nipples.  Using nipple shield)  Assisted mom with waking and latching [name] to left breast with her own #20 Lansinoh nipple shield.  It took several hand expression attempts to get a couple of drops of colostrum on tip of nipple.  She takes a few strong sucks and then comes off the breast requiring deeper re latch.  This continued for 15 to 20 minutes.  When she finally came off and appeared satiated there was a small amount of colostrum in nipple shield.  Nipple was more everted after compression, reverse pressure and Caroline sucking strongly pulling nipple out and more into nipple shield.  Mom reports [name] is latching so much better than her first baby ever did.  Lactation name and number written on white board and encouraged to call with any questions, concerns or assistance. Maternal Data Formula Feeding for Exclusion: No Has patient been taught Hand Expression?: Yes(Can hand express a couple of drops of colostrum) Does the patient have breastfeeding experience prior to this delivery?: Yes  Feeding Feeding Type: Breast Fed  LATCH Score Latch: Repeated attempts needed to sustain latch, nipple held in mouth throughout feeding, stimulation needed to elicit sucking reflex.  Audible Swallowing: A few with stimulation  Type of Nipple: Inverted(Everts some with compression, Caroline sucking and nipple shield)  Comfort (Breast/Nipple): Soft / non-tender  Hold (Positioning): Assistance needed to correctly position infant at breast and maintain latch.  LATCH Score: 5  Interventions Interventions: Position options;Assisted with latch;Skin to skin;Breast compression;Breast massage;Adjust position;Support pillows;Reverse pressure  Lactation Tools  Discussed/Used Tools: Nipple Shields Nipple shield size: 20(Mom has own Lansinoh shields) WIC Program: [employer] Insurance)   Consult Status Consult Status: Follow-up Follow-up type: Call as needed    Louis MeckelWilliams, Gurvir Schrom Kay 01/05/2018, 7:35 PM

## 2018-01-05 NOTE — Anesthesia Postprocedure Evaluation (Signed)
Anesthesia Post Note  Patient: Kristine SeniorMeredith W Fry  Procedure(s) Performed: CESAREAN SECTION, repeat (N/A )  Patient location during evaluation: L&D Anesthesia Type: Spinal Level of consciousness: awake, awake and alert and oriented Pain management: pain level controlled Vital Signs Assessment: post-procedure vital signs reviewed and stable Respiratory status: spontaneous breathing Cardiovascular status: blood pressure returned to baseline and stable Postop Assessment: no headache Anesthetic complications: no     Last Vitals:  Vitals:   01/05/18 0625 01/05/18 0900  BP: 103/63 92/60  Pulse: 86   Resp:  15  Temp:  36.6 C    Last Pain:  Vitals:   01/05/18 0900  TempSrc: Axillary  PainSc:                  Vernie MurdersPope,  Bria Sparr G

## 2018-01-05 NOTE — Anesthesia Post-op Follow-up Note (Signed)
Anesthesia QCDR form completed.        

## 2018-01-05 NOTE — Op Note (Signed)
NAME: [name], Easton W. MEDICAL RECORD [id number] ACCOUNT [id number] DATE OF BIRTH:01-19-1986 FACILITY: [facility] LOCATION: [facility] PHYSICIAN:Anneta Rounds Cloyde ReamsJ. Eufelia Veno, MD  OPERATIVE REPORT  DATE OF PROCEDURE:  01/05/2018  PREOPERATIVE DIAGNOSES:   1.  39+1 weeks estimated gestational age. 2.  Previous cesarean section.  POSTOPERATIVE DIAGNOSES:   1.  39+1 weeks estimated gestational age. 2.  Elective repeat cesarean section.  PROCEDURE:  Elective repeat low transverse cesarean section.  ANESTHESIA:  Spinal.  SURGEON:  Suzy Bouchardhomas J. Amoni Scallan, MD   FIRST ASSISTANT:  Milon Scorearon  Jones, Certified Nurse Midwife.  INDICATIONS:  A 32 year old gravida 3, para 1 patient with a previous cesarean section elects for a repeat cesarean section.  The patient declines permanent sterilization.  DESCRIPTION OF PROCEDURE:  After adequate spinal anesthesia, the patient was placed in dorsal supine position, hip roll on the right side.  The patient did receive 2 grams IV Ancef prior to commencement of the case.  Timeout was performed.  A  Pfannenstiel incision was made 2 fingerbreadths above the symphysis pubis.  Sharp dissection was used to identify the fascia.  Fascia was opened in the midline and opened in a transverse fashion.  Superior aspect of the fascia was grasped with Kocher  clamps and the recti muscles were dissected free.  Inferior aspect of the fascia was grasped with Kocher clamps and the pyramidalis muscle was dissected free.  Entry into the peritoneal cavity was accomplished sharply.  The vesicouterine peritoneal fold  was identified and a bladder flap was created and the bladder was reflected inferiorly.  Low transverse uterine incision was made.  Upon entry into the endometrial cavity, clear fluid resulted.  A large fetal head was brought to the incision.  A Kiwi  vacuum was applied to the occiput.  With one gentle pull, the head was delivered and the vacuum was removed.  Shoulders and  body were then delivered.  A large female was dried on the patient's abdomen.  After 60 seconds, cord was doubly clamped and  infant was passed to nursery staff who assigned Apgar scores of 9 and 9.  The placenta was manually delivered and the uterus was exteriorized.  Endometrial cavity was wiped clean with laparotomy tape.  Cervix was opened with ring forceps and this  instrument was passed off the operative field.  Uterine incision was closed with #1 chromic suture in a running locking fashion.  Good approximation of edges.  Good hemostasis was noted.  Fallopian tubes and ovaries appeared normal.  Posterior cul-de-sac  was irrigated and suctioned and the uterus was placed back into the abdominal cavity.  The pericolic gutters were wiped clean with laparotomy tape.  Uterine incision appeared hemostatic.  Interceed was placed over the uterine incision in a T-shaped  fashion.  Fascia was then closed with 0 Vicryl suture in a running nonlocking fashion.  Good hemostasis was noted.  The fascial edges were injected with a solution of 20 mL of 1.3% Exparel plus 30 mL of 0.5% Marcaine and 50 mL normal saline;  60 mL were  injected in the fascial edges.  Subcutaneous tissues were irrigated and bovied for hemostasis and the skin was reapproximated with Insorb absorbable staples.  An additional 30 mL of the Exparel solution was injected beneath the skin.  COMPLICATIONS:  There were no complications.  ESTIMATED  BLOOD LOSS:  763 mL.  INTRAOPERATIVE FLUIDS:  700 mL.  The patient tolerated the procedure well and was taken to recovery room in good condition.  Time of birth  1610 on 01/05/2018.  AN/NUANCE  D:01/05/2018 T:01/05/2018 [id number]

## 2018-01-05 NOTE — Progress Notes (Signed)
Patient ID: [name], female   DOB: 1986-01-31, 32 y.o.   MRN: [id number] Ready for repeat LTCS . Declines BTL

## 2018-01-05 NOTE — Discharge Summary (Signed)
Obstetric Discharge Summary   Patient ID: Patient Name: [name] DOB: [birth year] MRN: [id number]  Date of Admission: 01/05/2018 Date of Delivery:01/05/2018 Delivered NW:[id number] MD Date of Discharge: 01/07/2018  Primary OB: [facility]  LMP:No LMP recorded. EDC Estimated Date of Delivery: 01/11/18 Gestational Age at Delivery: [redacted]w[redacted]d   Antepartum complications: POTS Admitting Diagnosis: elective repeat LTCS   Secondary Diagnoses: Patient Active Problem List   Diagnosis Date Noted  . Delivery with history of cesarean section 01/05/2018  . Post-operative state 07/01/2014  . Labor and delivery, indication for care 06/29/2014  . Tachycardia   . Orthostatic lightheadedness     Augmentation: None Complications: None Intrapartum complications/course:  Delivery Type: repeat cesarean section, low transverse incision Anesthesia: epidural Placenta: spontaneous Laceration:  Episiotomy: none  Newborn Data: Live born female  Birth Weight: 8 lb 12 oz (3970 g) APGAR: 9, 9  TOB 01/05/18 at 0812  Newborn Delivery   Birth date/time:  01/05/2018 08:16:00 Delivery type:  C-Section, Vacuum Assisted Trial of labor:  No C-section categorization:  Repeat         Postpartum Course  Patient had an uncomplicated postpartum course.  By time of discharge on POD#2, her pain was controlled on oral pain medications; she had appropriate lochia and was ambulating, voiding without difficulty and tolerating regular diet.  She was deemed stable for discharge to home.       Labs: CBC Latest Ref Rng & Units 01/07/2018 01/06/2018 01/05/2018  WBC 4.0 - 10.5 K/uL 10.1 11.2(H) 9.7  Hemoglobin 12.0 - 15.0 g/dL 8.4(O) 9.6(E) 10.9(L)  Hematocrit 36.0 - 46.0 % 29.2(L) 27.6(L) 32.8(L)  Platelets 150 - 400 K/uL 183 153 199   AB POS  Physical exam:  BP 97/67 (BP Location: Left Arm)   Pulse 83   Temp 98.3 F (36.8 C) (Oral)   Resp 18   Ht 5\' 4"  (1.626 m)   Wt 76.2 kg   SpO2 100%    Breastfeeding? Unknown   BMI 28.84 kg/m  General: alert and no distress Pulm: normal respiratory effort Lochia: appropriate Abdomen: soft, NT Uterine Fundus: firm, below umbilicus Incision: c/d/i, healing well, no significant drainage, no dehiscence, no significant erythema Extremities: No evidence of DVT seen on physical exam. No lower extremity edema.   Disposition: stable, discharge to home Baby Feeding: breastmilk Baby Disposition: home with mom  Contraception: POPs  Prenatal Labs:  ABO, Rh:  AB+ Antibody:  neg Rubella:  Imm , Var Imm RPR:   NR HBsAg:    HIV:   Neg GBS: neg     Rh Immune globulin given: n/a  Plan:  AMERA BANOS was discharged to home in good condition. Follow-up appointment at [facility] with delivery provider in 2 weeks  Discharge Instructions: Per After Visit Summary. Activity: Advance as tolerated. Pelvic rest for 6 weeks.   Diet: Regular Discharge Medications: Allergies as of 01/07/2018   No Known Allergies     Medication List    TAKE these medications   docusate sodium 100 MG capsule Commonly known as:  COLACE Take 1 capsule (100 mg total) by mouth 2 (two) times daily for 14 days. To keep stools soft, as needed   ferrous sulfate 325 (65 FE) MG tablet Take 1 tablet (325 mg total) by mouth daily with breakfast. Take with Vitamin C   ibuprofen 800 MG tablet Commonly known as:  ADVIL,MOTRIN Take 1 tablet (800 mg total) by mouth every 8 (eight) hours as needed for moderate pain  or cramping.   IRON PO Take 1 tablet by mouth every evening.   oxyCODONE-acetaminophen 5-325 MG tablet Commonly known as:  PERCOCET/ROXICET Take 1-2 tablets by mouth every 6 (six) hours as needed for severe pain.   prenatal multivitamin Tabs tablet Take 1 tablet by mouth every evening.      Outpatient follow up:  With delivering provider in 2 weeks  Signed:  Christeen DouglasBethany Letishia Elliott

## 2018-01-05 NOTE — Anesthesia Preprocedure Evaluation (Signed)
Anesthesia Evaluation  Patient identified by MRN, date of birth, ID band Patient awake    Reviewed: Allergy & Precautions, NPO status , Patient's Chart, lab work & pertinent test results  History of Anesthesia Complications Negative for: history of anesthetic complications  Airway Mallampati: II  TM Distance: >3 FB Neck ROM: Full    Dental no notable dental hx.    Pulmonary neg pulmonary ROS, neg sleep apnea, neg COPD,    breath sounds clear to auscultation- rhonchi (-) wheezing      Cardiovascular Exercise Tolerance: Good (-) hypertension(-) CAD and (-) Past MI  Rhythm:Regular Rate:Normal - Systolic murmurs and - Diastolic murmurs POTS   Neuro/Psych negative neurological ROS  negative psych ROS   GI/Hepatic negative GI ROS, Neg liver ROS,   Endo/Other  negative endocrine ROSneg diabetes  Renal/GU negative Renal ROS     Musculoskeletal negative musculoskeletal ROS (+)   Abdominal Gravid abdomen  Peds  Hematology negative hematology ROS (+)   Anesthesia Other Findings Past Medical History: No date: Orthostatic lightheadedness No date: POTS (postural orthostatic tachycardia syndrome) No date: Tachycardia No date: Wisdom teeth extracted   Reproductive/Obstetrics (+) Pregnancy                             Lab Results  Component Value Date   WBC 9.7 01/05/2018   HGB 10.9 (L) 01/05/2018   HCT 32.8 (L) 01/05/2018   MCV 92.1 01/05/2018   PLT 199 01/05/2018    Anesthesia Physical Anesthesia Plan  ASA: II  Anesthesia Plan: Spinal   Post-op Pain Management:    Induction:   PONV Risk Score and Plan: 2 and Ondansetron  Airway Management Planned: Natural Airway  Additional Equipment:   Intra-op Plan:   Post-operative Plan:   Informed Consent: I have reviewed the patients History and Physical, chart, labs and discussed the procedure including the risks, benefits and  alternatives for the proposed anesthesia with the patient or authorized representative who has indicated his/her understanding and acceptance.   Dental advisory given  Plan Discussed with: CRNA and Anesthesiologist  Anesthesia Plan Comments:         Anesthesia Quick Evaluation

## 2018-01-05 NOTE — Lactation Note (Signed)
This note was copied from a baby's chart. Lactation Consultation Note  Patient Name: Girl Edra Pro ZOLoann QuillXWR'UToday's Date: 01/05/2018   Mom has hard, large areola and slightly inverted nipples.  Attempted to hand express, but only got a couple of drops after several attempts.  Mom brought her own nipple shield which she used for first breast feed in the birthplace.  Mom reports trying to breast feed for 9 weeks with first.  She used the nipple shield for the entire 9 weeks.  She reports never having enough milk.  She would breast feed for 10 minutes on each breast and then given bottle of formula.  Reviewed supply and demand, normal course of lactation and routine newborn feeding patterns.    Maternal Data    Feeding    LATCH Score                   Interventions    Lactation Tools Discussed/Used     Consult Status      Louis MeckelWilliams, Tighe Gitto Kay 01/05/2018, 4:23 PM

## 2018-01-05 NOTE — Brief Op Note (Signed)
01/05/2018  9:02 AM  PATIENT:  [name]  32 y.o. female  PRE-OPERATIVE DIAGNOSIS:  repeat c-section  POST-OPERATIVE DIAGNOSIS:  repeat c-section  PROCEDURE:  Procedure(s): CESAREAN SECTION, repeat (N/A)  SURGEON:  Surgeon(s) and Role:    * Schermerhorn, Ihor Austinhomas J, MD - Primary  PHYSICIAN ASSISTANT:caron Yetta BarreJones   ASSISTANTS: none   ANESTHESIA:   spinal  EBL:  763 mL   BLOOD ADMINISTERED:none  DRAINS: Urinary Catheter (Foley)   LOCAL MEDICATIONS USED:  MARCAINE    and BUPIVICAINE   SPECIMEN:  No Specimen  DISPOSITION OF SPECIMEN:  N/A  COUNTS:  YES  TOURNIQUET:  * No tourniquets in log *  DICTATION: .Other Dictation: Dictation Number verbal  PLAN OF CARE: Admit to inpatient   PATIENT DISPOSITION:  PACU - hemodynamically stable.   Delay start of Pharmacological VTE agent (>24hrs) due to surgical blood loss or risk of bleeding: not applicable

## 2018-01-05 NOTE — Anesthesia Procedure Notes (Signed)
Spinal  Patient location during procedure: OR Start time: 01/05/2018 7:54 AM End time: 01/05/2018 8:05 AM Staffing Anesthesiologist: Emmie Niemann, MD Resident/CRNA: Allean Found, CRNA Performed: anesthesiologist and resident/CRNA  Preanesthetic Checklist Completed: patient identified, site marked, surgical consent, pre-op evaluation, timeout performed, IV checked, risks and benefits discussed and monitors and equipment checked Spinal Block Patient position: sitting Prep: ChloraPrep Patient monitoring: heart rate, continuous pulse ox, blood pressure and cardiac monitor Approach: midline Location: L4-5 Injection technique: single-shot Needle Needle type: Introducer and Pencil-Tip  Needle gauge: 24 G Needle length: 9 cm Additional Notes Negative paresthesia. Negative blood return. Positive free-flowing CSF. Expiration date of kit checked and confirmed. Patient tolerated procedure well, without complications.

## 2018-01-06 ENCOUNTER — Encounter: Payer: Self-pay | Admitting: Obstetrics and Gynecology

## 2018-01-06 LAB — CBC
HCT: 27.6 % — ABNORMAL LOW (ref 36.0–46.0)
HEMOGLOBIN: 9 g/dL — AB (ref 12.0–15.0)
MCH: 30.3 pg (ref 26.0–34.0)
MCHC: 32.6 g/dL (ref 30.0–36.0)
MCV: 92.9 fL (ref 80.0–100.0)
Platelets: 153 10*3/uL (ref 150–400)
RBC: 2.97 MIL/uL — ABNORMAL LOW (ref 3.87–5.11)
RDW: 13.8 % (ref 11.5–15.5)
WBC: 11.2 10*3/uL — ABNORMAL HIGH (ref 4.0–10.5)
nRBC: 0 % (ref 0.0–0.2)

## 2018-01-06 MED ORDER — FERROUS SULFATE 325 (65 FE) MG PO TABS
325.0000 mg | ORAL_TABLET | Freq: Two times a day (BID) | ORAL | Status: DC
  Administered 2018-01-06 – 2018-01-07 (×3): 325 mg via ORAL
  Filled 2018-01-06 (×3): qty 1

## 2018-01-06 NOTE — Progress Notes (Signed)
Post Op Day 1  Subjective: Doing well, no concerns. Ambulating without difficulty, pain managed with PO meds, tolerating regular diet, and voiding without difficulty.   No fever/chills, chest pain, shortness of breath, nausea/vomiting, or leg pain. No nipple or breast pain.   Objective: BP 102/72 (BP Location: Right Arm)   Pulse 93   Temp 98 F (36.7 C) (Oral)   Resp 20   Ht 5\' 4"  (1.626 m)   Wt 76.2 kg   SpO2 99%   Breastfeeding? Unknown   BMI 28.84 kg/m    Physical Exam:  General: alert, cooperative, appears stated age and no distress Breasts: soft/nontender CV: RRR Pulm: nl effort, CTABL Abdomen: soft, non-tender, active bowel sounds Uterine Fundus: firm Incision: old drainage on dressing, otherwise CDI Lochia: appropriate DVT Evaluation: No evidence of DVT seen on physical exam. No cords or calf tenderness. No significant calf/ankle edema.  Recent Labs    01/05/18 0610 01/06/18 0414  HGB 10.9* 9.0*  HCT 32.8* 27.6*  WBC 9.7 11.2*  PLT 199 153    Assessment/Plan: 32 y.o. G3P2002 postpartum day # 1  -Continue routine postpartum care -Lactation consult PRN for breastfeeding -Acute blood loss anemia - hemodynamically stable and asymptomatic; start PO ferrous sulfate BID with stool softeners  -Immunization status: all immunizations up to date  Disposition: Continue inpatient postpartum care    LOS: 1 day   [name], CNM 01/06/2018, 9:23 AM   ----- [name] Certified Nurse Midwife [facility]

## 2018-01-07 LAB — CBC
HCT: 29.2 % — ABNORMAL LOW (ref 36.0–46.0)
Hemoglobin: 9.5 g/dL — ABNORMAL LOW (ref 12.0–15.0)
MCH: 30.7 pg (ref 26.0–34.0)
MCHC: 32.5 g/dL (ref 30.0–36.0)
MCV: 94.5 fL (ref 80.0–100.0)
NRBC: 0 % (ref 0.0–0.2)
Platelets: 183 10*3/uL (ref 150–400)
RBC: 3.09 MIL/uL — ABNORMAL LOW (ref 3.87–5.11)
RDW: 14 % (ref 11.5–15.5)
WBC: 10.1 10*3/uL (ref 4.0–10.5)

## 2018-01-07 MED ORDER — OXYCODONE-ACETAMINOPHEN 5-325 MG PO TABS: 1.0000 | ORAL_TABLET | Freq: Four times a day (QID) | ORAL | 0 refills | Status: DC | PRN

## 2018-01-07 MED ORDER — FERROUS SULFATE 325 (65 FE) MG PO TABS: 325.0000 mg | ORAL_TABLET | Freq: Every day | ORAL | 1 refills | Status: DC

## 2018-01-07 MED ORDER — IBUPROFEN 800 MG PO TABS: 800.0000 mg | ORAL_TABLET | Freq: Three times a day (TID) | ORAL | 1 refills | Status: DC | PRN

## 2018-01-07 MED ORDER — DOCUSATE SODIUM 100 MG PO CAPS: 100.0000 mg | ORAL_CAPSULE | Freq: Two times a day (BID) | ORAL | 0 refills | Status: AC

## 2018-01-07 NOTE — Progress Notes (Signed)
Patient discharged home with infant. Discharge instructions, prescriptions and follow up appointment . Instructions given to and reviewed with patient. Patient verbalized understanding. Patient wheeled out with infant by auxiliary.

## 2018-02-01 ENCOUNTER — Emergency Department: Payer: BLUE CROSS/BLUE SHIELD

## 2018-02-01 ENCOUNTER — Other Ambulatory Visit: Payer: Self-pay

## 2018-02-01 ENCOUNTER — Other Ambulatory Visit
Admission: RE | Admit: 2018-02-01 | Discharge: 2018-02-01 | Disposition: A | Discharge status: Home / Self Care | Payer: BLUE CROSS/BLUE SHIELD | Source: Ambulatory Visit | Attending: Family Medicine | Admitting: Family Medicine

## 2018-02-01 ENCOUNTER — Emergency Department
Admission: EM | Admit: 2018-02-01 | Discharge: 2018-02-01 | Disposition: A | Discharge status: Home / Self Care | Payer: BLUE CROSS/BLUE SHIELD | Attending: Emergency Medicine | Admitting: Emergency Medicine

## 2018-02-01 ENCOUNTER — Encounter: Payer: Self-pay | Admitting: Emergency Medicine

## 2018-02-01 DIAGNOSIS — B349 Viral infection, unspecified: Secondary | ICD-10-CM | POA: Diagnosis not present

## 2018-02-01 DIAGNOSIS — R509 Fever, unspecified: Secondary | ICD-10-CM | POA: Insufficient documentation

## 2018-02-01 DIAGNOSIS — Z79899 Other long term (current) drug therapy: Secondary | ICD-10-CM | POA: Insufficient documentation

## 2018-02-01 DIAGNOSIS — J069 Acute upper respiratory infection, unspecified: Secondary | ICD-10-CM | POA: Diagnosis not present

## 2018-02-01 DIAGNOSIS — R05 Cough: Secondary | ICD-10-CM | POA: Diagnosis present

## 2018-02-01 LAB — INFLUENZA PANEL BY PCR (TYPE A & B)
Influenza A By PCR: NEGATIVE
Influenza B By PCR: NEGATIVE

## 2018-02-01 LAB — CBC WITH DIFFERENTIAL/PLATELET
Abs Immature Granulocytes: 0.1 10*3/uL — ABNORMAL HIGH (ref 0.00–0.07)
Basophils Absolute: 0 10*3/uL (ref 0.0–0.1)
Basophils Relative: 0 %
EOS PCT: 1 %
Eosinophils Absolute: 0.1 10*3/uL (ref 0.0–0.5)
HCT: 33.8 % — ABNORMAL LOW (ref 36.0–46.0)
Hemoglobin: 10.9 g/dL — ABNORMAL LOW (ref 12.0–15.0)
Immature Granulocytes: 1 %
Lymphocytes Relative: 24 %
Lymphs Abs: 2.6 10*3/uL (ref 0.7–4.0)
MCH: 28.9 pg (ref 26.0–34.0)
MCHC: 32.2 g/dL (ref 30.0–36.0)
MCV: 89.7 fL (ref 80.0–100.0)
MONO ABS: 0.5 10*3/uL (ref 0.1–1.0)
MONOS PCT: 5 %
Neutro Abs: 7.6 10*3/uL (ref 1.7–7.7)
Neutrophils Relative %: 69 %
Platelets: 394 10*3/uL (ref 150–400)
RBC: 3.77 MIL/uL — ABNORMAL LOW (ref 3.87–5.11)
RDW: 13 % (ref 11.5–15.5)
WBC: 11 10*3/uL — ABNORMAL HIGH (ref 4.0–10.5)
nRBC: 0 % (ref 0.0–0.2)

## 2018-02-01 LAB — BASIC METABOLIC PANEL
Anion gap: 11 (ref 5–15)
BUN: 9 mg/dL (ref 6–20)
CO2: 27 mmol/L (ref 22–32)
Calcium: 8.9 mg/dL (ref 8.9–10.3)
Chloride: 102 mmol/L (ref 98–111)
Creatinine, Ser: 0.76 mg/dL (ref 0.44–1.00)
GFR calc Af Amer: >60 mL/min (ref >60)
GFR calc non Af Amer: >60 mL/min (ref >60)
Glucose, Bld: 96 mg/dL (ref 70–99)
Potassium: 3.3 mmol/L — ABNORMAL LOW (ref 3.5–5.1)
Sodium: 140 mmol/L (ref 135–145)

## 2018-02-01 LAB — FIBRIN DERIVATIVES D-DIMER (ARMC ONLY): Fibrin derivatives D-dimer (ARMC): 893.1 ng/mL (FEU) — ABNORMAL HIGH (ref 0.00–499.00)

## 2018-02-01 MED ORDER — IOHEXOL 350 MG/ML SOLN
75.0000 mL | Freq: Once | INTRAVENOUS | Status: AC | PRN
  Administered 2018-02-01: 75 mL via INTRAVENOUS

## 2018-02-01 NOTE — ED Provider Notes (Signed)
[facility] Emergency Department Provider Note   ____________________________________________    I have reviewed the triage vital signs and the nursing notes.   HISTORY  Chief Complaint Fever and Abnormal Lab     HPI [name] is a 32 y.o. female who presents with complaints of cough, fever, chills.  Patient seen at urgent care and apparently had a d-dimer sent because of elevated heart rate, this d-dimer came back mildly elevated so she was sent to the emergency department.  She reports she feels overall quite well.  No pleurisy.  No shortness of breath.  No nausea vomiting or diaphoresis.  No calf pain or swelling.  No recent travel.   Past Medical History:  Diagnosis Date  . Orthostatic lightheadedness   . POTS (postural orthostatic tachycardia syndrome)   . Tachycardia   . Wisdom teeth extracted     Patient Active Problem List   Diagnosis Date Noted  . Delivery with history of cesarean section 01/05/2018  . Post-operative state 07/01/2014  . Labor and delivery, indication for care 06/29/2014  . Tachycardia   . Orthostatic lightheadedness     Past Surgical History:  Procedure Laterality Date  . CESAREAN SECTION N/A 06/30/2014   Procedure: CESAREAN SECTION;  Surgeon: Suzy Bouchardhomas J Schermerhorn, MD;  Location: [facility] ORS;  Service: Obstetrics;  Laterality: N/A;  . CESAREAN SECTION N/A 01/05/2018   Procedure: CESAREAN SECTION, repeat;  Surgeon: Schermerhorn, Ihor Austinhomas J, MD;  Location: [facility] ORS;  Service: Obstetrics;  Laterality: N/A;  . DILATION AND EVACUATION N/A 11/04/2016   Procedure: DILATATION AND EVACUATION;  Surgeon: Feliberto GottronSchermerhorn, Ihor Austinhomas J, MD;  Location: [facility] ORS;  Service: Gynecology;  Laterality: N/A;    Prior to Admission medications   Medication Sig Start Date End Date Taking? Authorizing Provider  ferrous sulfate 325 (65 FE) MG tablet Take 1 tablet (325 mg total) by mouth daily with breakfast. Take with Vitamin C 01/07/18 03/08/18   Christeen DouglasBeasley, Bethany, MD  ibuprofen (ADVIL,MOTRIN) 800 MG tablet Take 1 tablet (800 mg total) by mouth every 8 (eight) hours as needed for moderate pain or cramping. 01/07/18   Christeen DouglasBeasley, Bethany, MD  IRON PO Take 1 tablet by mouth every evening.    [provider]  oxyCODONE-acetaminophen (PERCOCET/ROXICET) 5-325 MG tablet Take 1-2 tablets by mouth every 6 (six) hours as needed for severe pain. 01/07/18   Christeen DouglasBeasley, Bethany, MD  Prenatal Vit-Fe Fumarate-FA (PRENATAL MULTIVITAMIN) TABS tablet Take 1 tablet by mouth every evening.     [provider]     Allergies Patient has no known allergies.  No family history on file.  Social History Social History   Tobacco Use  . Smoking status: Never Smoker  . Smokeless tobacco: Never Used  Substance Use Topics  . Alcohol use: No    Comment: 2 glasses of wine a week  . Drug use: No    Review of Systems  Constitutional: No fever/chills Eyes: No visual changes.  ENT: No sore throat. Cardiovascular: As above Respiratory: As above Gastrointestinal: No abdominal pain.   Genitourinary: Negative for dysuria. Musculoskeletal: Negative for back pain. Skin: Negative for rash. Neurological: Negative for headaches    ____________________________________________   PHYSICAL EXAM:  VITAL SIGNS: ED Triage Vitals  Enc Vitals Group     BP 02/01/18 1757 127/83     Pulse Rate 02/01/18 1757 (!) 115     Resp 02/01/18 1757 14     Temp 02/01/18 1757 100 F (37.8 C)  Temp Source 02/01/18 1757 Oral     SpO2 02/01/18 1757 100 %     Weight --      Height --      Head Circumference --      Peak Flow --      Pain Score 02/01/18 1804 0     Pain Loc --      Pain Edu? --      Excl. in GC? --     Constitutional: Alert and oriented. No acute distress.  Eyes: Conjunctivae are normal.   Nose: No congestion/rhinnorhea. Mouth/Throat: Mucous membranes are moist.    Cardiovascular: Mild tachycardia, regular rhythm.  Good peripheral  circulation. Respiratory: Normal respiratory effort.  No retractions. Lungs CTAB. Gastrointestinal: Soft and nontender. No distention.  No CVA tenderness. Genitourinary: deferred Musculoskeletal: No lower extremity tenderness nor edema.  Warm and well perfused Neurologic:  Normal speech and language. No gross focal neurologic deficits are appreciated.  Skin:  Skin is warm, dry and intact. No rash noted. Psychiatric: Mood and affect are normal. Speech and behavior are normal.  ____________________________________________   LABS (all labs ordered are listed, but only abnormal results are displayed)  Labs Reviewed  CBC WITH DIFFERENTIAL/PLATELET - Abnormal; Notable for the following components:      Result Value   WBC 11.0 (*)    RBC 3.77 (*)    Hemoglobin 10.9 (*)    HCT 33.8 (*)    Abs Immature Granulocytes 0.10 (*)    All other components within normal limits  BASIC METABOLIC PANEL - Abnormal; Notable for the following components:   Potassium 3.3 (*)    All other components within normal limits  INFLUENZA PANEL BY PCR (TYPE A & B)   ____________________________________________  EKG  None ____________________________________________  RADIOLOGY  Chest x-ray unremarkable CT angiography ____________________________________________   PROCEDURES  Procedure(s) performed: No  Procedures   Critical Care performed: No ____________________________________________   INITIAL IMPRESSION / ASSESSMENT AND PLAN / ED COURSE  Pertinent labs & imaging results that were available during my care of the patient were reviewed by me and considered in my medical decision making (see chart for details).  Patient sent to the ED for elevated d-dimer and CT angiography.  Strongly doubt PE however the patient would like to proceed with CT angiography.  CT angiography is negative for pneumonia or PE    ____________________________________________   FINAL CLINICAL IMPRESSION(S) / ED  DIAGNOSES  Final diagnoses:  Viral illness  Viral upper respiratory tract infection        Note:  This document was prepared using Dragon voice recognition software and may include unintentional dictation errors.   Jene EveryKinner, Otisha Spickler, MD 02/01/18 [phone]

## 2018-02-01 NOTE — ED Triage Notes (Addendum)
Pt to ED via POV, [facility]. Pt was being evaluated for fever, pt states that she has had fever as high as 100.7. pt had C- section on 01/05/18 and was told to come in if she had fever over 100.4. Pt also has cough. Pt states that she is not having any issues with her incision, no redness or drainage. [facility] and was called back and told she needed to come to the ED because her D-Dimer was 893. Pt denies chest pain, states that she has had some difficulty breathing on Thursday. Pt is in NAD at this time. Pt is tachycardic but states that she has hx/o POTS

## 2019-08-18 DIAGNOSIS — Z3493 Encounter for supervision of normal pregnancy, unspecified, third trimester: Secondary | ICD-10-CM | POA: Insufficient documentation

## 2019-08-18 LAB — OB RESULTS CONSOLE RUBELLA ANTIBODY, IGM: Rubella: IMMUNE

## 2019-08-18 LAB — OB RESULTS CONSOLE HIV ANTIBODY (ROUTINE TESTING): HIV: NONREACTIVE

## 2019-08-18 LAB — OB RESULTS CONSOLE VARICELLA ZOSTER ANTIBODY, IGG: Varicella: IMMUNE

## 2019-08-18 LAB — OB RESULTS CONSOLE RPR: RPR: NONREACTIVE

## 2019-08-18 LAB — OB RESULTS CONSOLE HEPATITIS B SURFACE ANTIGEN: Hepatitis B Surface Ag: NEGATIVE

## 2019-10-19 IMAGING — CT CT ANGIO CHEST
2 of 6 series · 18 of 46 positions shown · IV contrast (APPLIED)
Comparison: None.

CLINICAL DATA: Fever with elevated D-dimer. Shortness of breath and
tachycardia.

EXAM:
CT ANGIOGRAPHY CHEST WITH CONTRAST
TECHNIQUE: Multidetector CT imaging of the chest was performed using the
standard protocol during bolus administration of intravenous
contrast. Multiplanar CT image reconstructions and MIPs were
obtained to evaluate the vascular anatomy.
CONTRAST:  75mL OMNIPAQUE IOHEXOL 350 MG/ML SOLN

[Series 5: thins · axial · 0.59mm/px · z∈[-816,-563]mm · 15 of 279 slices shown]
[im 13/279  lung]
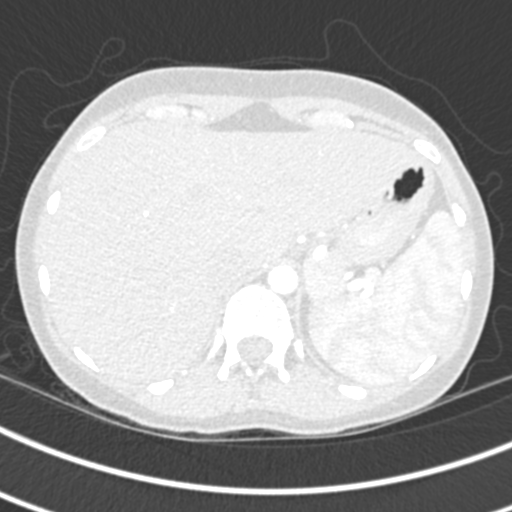
[im 37/279  soft-tissue]
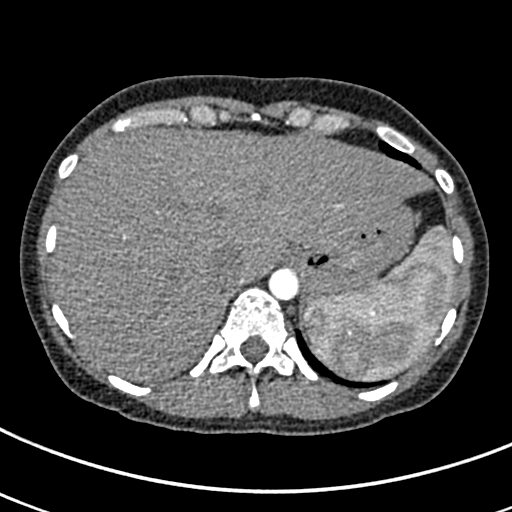
[im 49/279  lung]
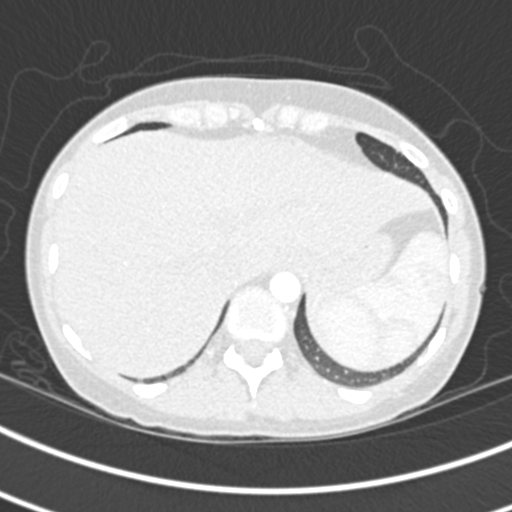
[im 73/279  soft-tissue]
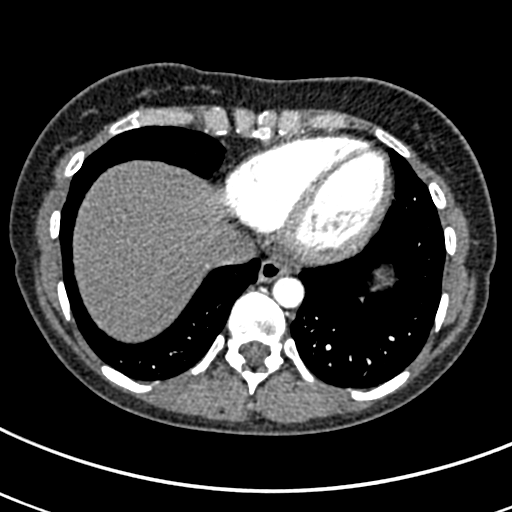
[im 85/279  lung]
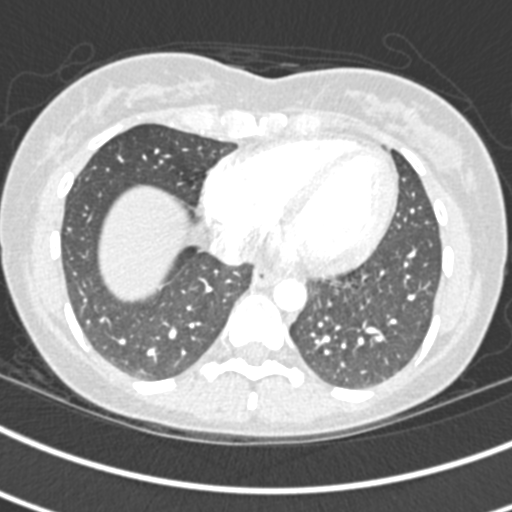
[im 109/279  soft-tissue]
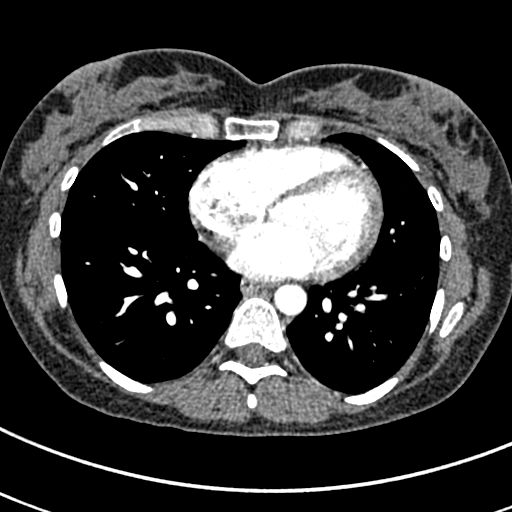
[im 121/279  lung]
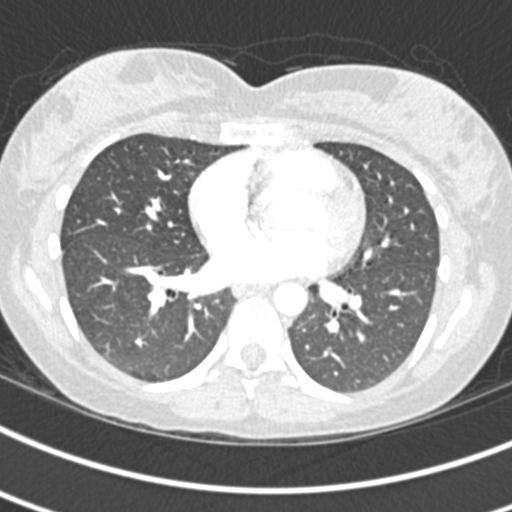
[im 146/279  soft-tissue]
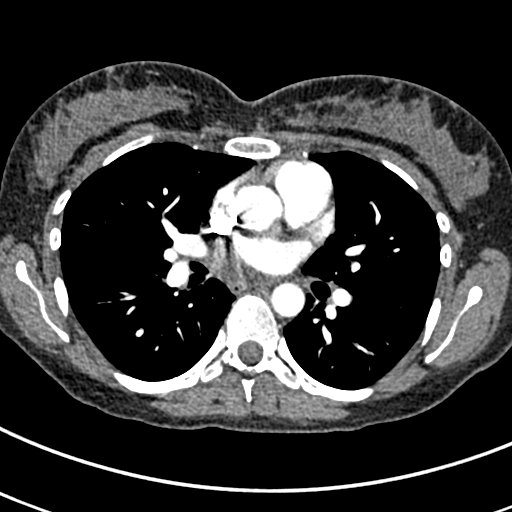
[im 158/279  lung]
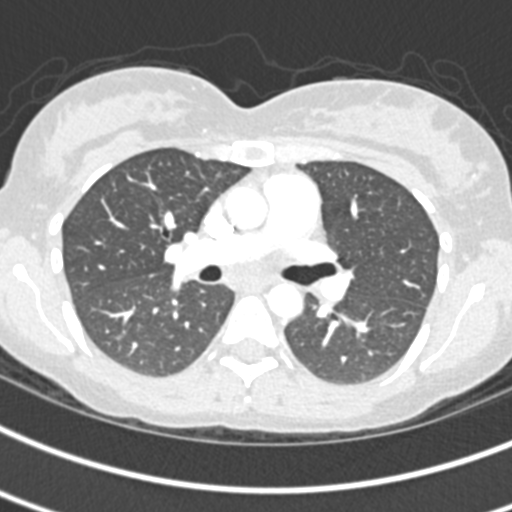
[im 170/279  soft-tissue]
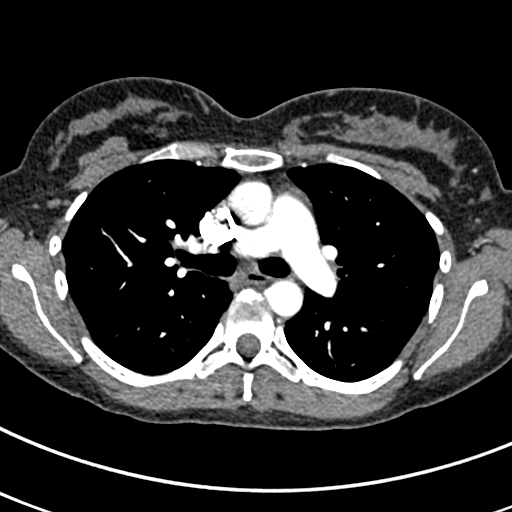
[im 194/279  lung]
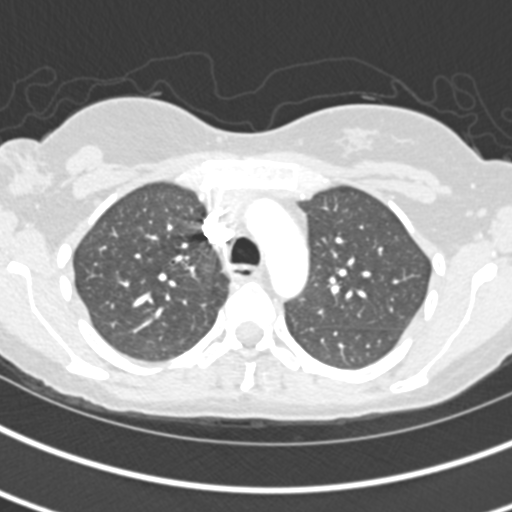
[im 206/279  soft-tissue]
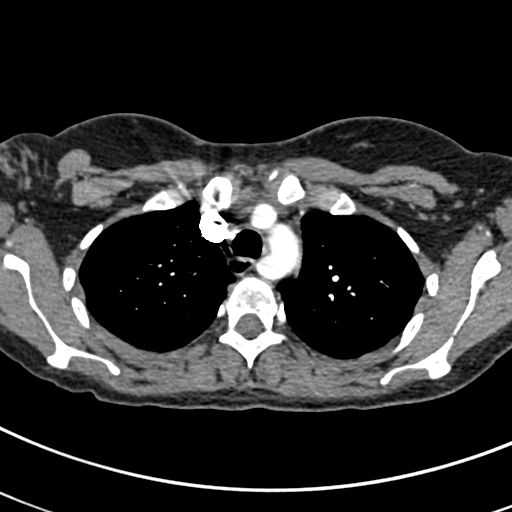
[im 230/279  lung]
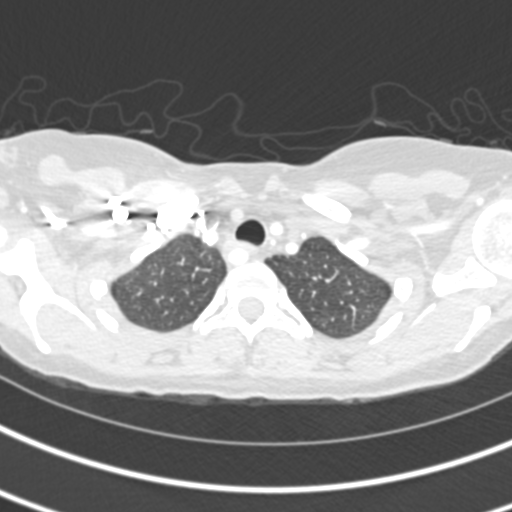
[im 242/279  soft-tissue]
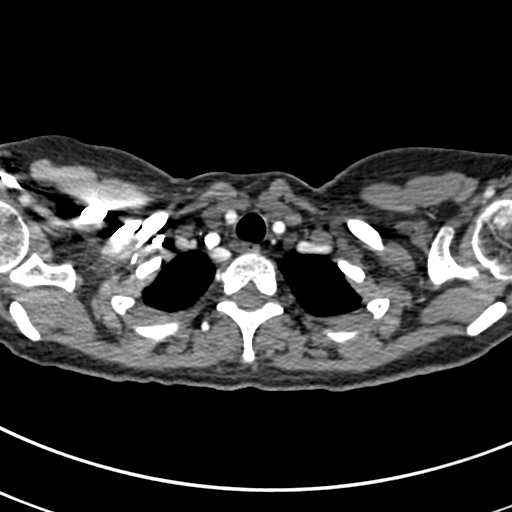
[im 266/279  lung]
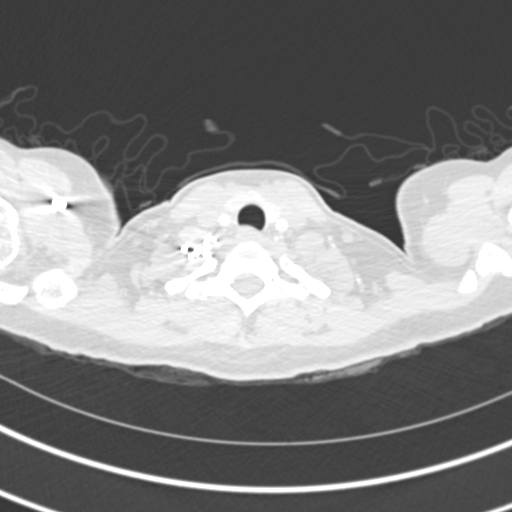

[Series 7: coronal mpr · coronal · 0.56mm/px · 3 of 79 slices shown]
[im 20/79  soft-tissue]
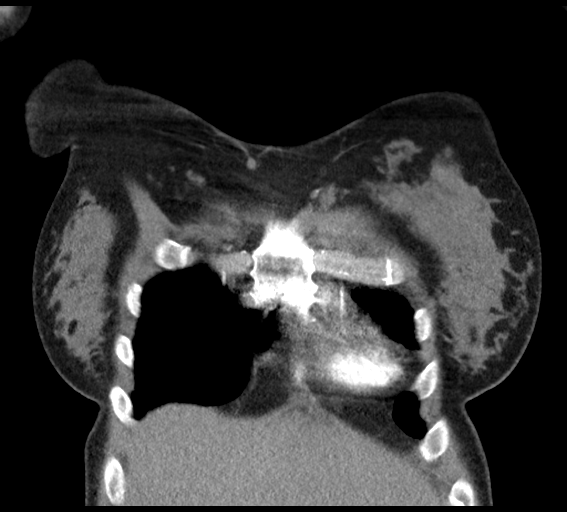
[im 40/79  soft-tissue]
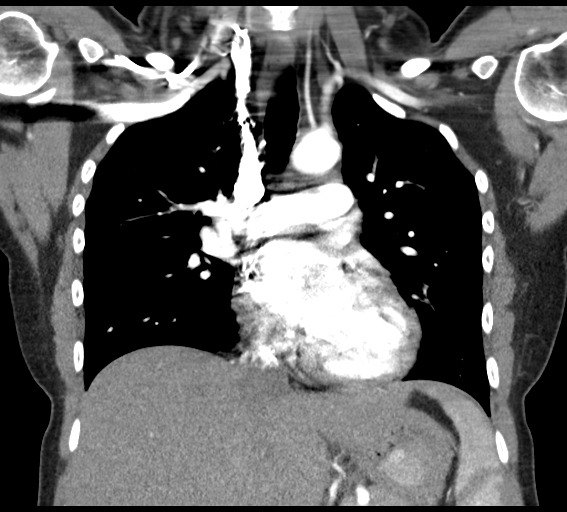
[im 59/79  soft-tissue]
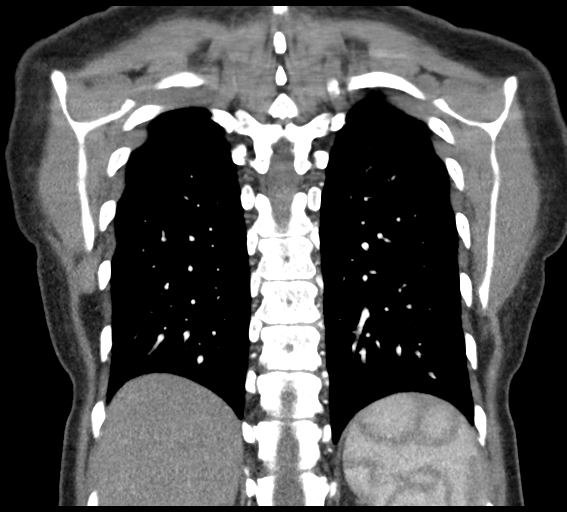

[18 of 46 positions shown; findings below may reference images not displayed]

FINDINGS: Cardiovascular:

--Pulmonary arteries: Contrast injection is sufficient to
demonstrate satisfactory opacification of the pulmonary arteries to
the segmental level. There is no pulmonary embolus. The main
pulmonary artery is within normal limits for size.

--Aorta: Satisfactory opacification of the thoracic aorta. No aortic
dissection or other acute aortic syndrome. There is an aberrant left
subclavian artery. The left vertebral artery originates
independently from the aortic arch. The aortic course and caliber
are normal. There is no aortic atherosclerosis.

--Heart: Normal size. No pericardial effusion.

Mediastinum/Nodes: No mediastinal, hilar or axillary
lymphadenopathy. The visualized thyroid and thoracic esophageal
course are unremarkable.

Lungs/Pleura: No pulmonary nodules or masses. No pleural effusion or
pneumothorax. No focal airspace consolidation. No focal pleural
abnormality.

Upper Abdomen: Contrast bolus timing is not optimized for evaluation
of the abdominal organs. Within this limitation, the visualized
organs of the upper abdomen are normal.

Musculoskeletal: No chest wall abnormality. No acute or significant
osseous findings.

Review of the MIP images confirms the above findings.
IMPRESSION: No pulmonary embolus or other acute thoracic abnormality.

## 2020-02-02 ENCOUNTER — Other Ambulatory Visit: Payer: BLUE CROSS/BLUE SHIELD

## 2020-02-15 LAB — OB RESULTS CONSOLE GBS: GBS: NEGATIVE

## 2020-02-15 LAB — OB RESULTS CONSOLE RPR: RPR: NONREACTIVE

## 2020-02-15 LAB — OB RESULTS CONSOLE GC/CHLAMYDIA
Chlamydia: NEGATIVE
Gonorrhea: NEGATIVE

## 2020-02-15 LAB — OB RESULTS CONSOLE HIV ANTIBODY (ROUTINE TESTING): HIV: NONREACTIVE

## 2020-02-23 NOTE — H&P (Signed)
[name] is a 35 y.o. female presenting for elective repeat LTCS + BTL on 03/06/20   EDC 03/12/20 OB History    Gravida  3   Para  2   Term  2   Preterm  0   AB  0   Living  2     SAB  0   IAB  0   Ectopic  0   Multiple  0   Live Births  2          Past Medical History:  Diagnosis Date  . Orthostatic lightheadedness   . POTS (postural orthostatic tachycardia syndrome)   . Tachycardia   . Wisdom teeth extracted    Past Surgical History:  Procedure Laterality Date  . CESAREAN SECTION N/A 06/30/2014   Procedure: CESAREAN SECTION;  Surgeon: Suzy Bouchard, MD;  Location: [facility] ORS;  Service: Obstetrics;  Laterality: N/A;  . CESAREAN SECTION N/A 01/05/2018   Procedure: CESAREAN SECTION, repeat;  Surgeon: Yetzali Weld, Ihor Austin, MD;  Location: [facility] ORS;  Service: Obstetrics;  Laterality: N/A;  . DILATION AND EVACUATION N/A 11/04/2016   Procedure: DILATATION AND EVACUATION;  Surgeon: Feliberto Gottron, Ihor Austin, MD;  Location: [facility] ORS;  Service: Gynecology;  Laterality: N/A;   Family History: family history is not on file. Social History:  reports that she has never smoked. She has never used smokeless tobacco. She reports that she does not drink alcohol and does not use drugs.     Maternal Diabetes: No Genetic Screening: Normal Maternal Ultrasounds/Referrals: Normal Fetal Ultrasounds or other Referrals:  None Maternal Substance Abuse:  No Significant Maternal Medications:  None Significant Maternal Lab Results:  Group B Strep negative Other Comments:  None  Review of Systems Review of Systems: A full review of systems was performed and negative except as noted in the HPI.   Eyes: no vision change  Ears: left ear pain  Oropharynx: no sore throat  Pulmonary . No shortness of breath , no hemoptysis Cardiovascular:  + presyncopal episode with standing Gastrointestinal:no blood in stool . No diarrhea, no constipation Uro gynecologic: no dysuria , no pelvic  pain Neurologic : no seizure , no migraines    Musculoskeletal: no muscular weakness  History   unknown if currently breastfeeding. Exam Physical Exam  LUngs CTA   CV RRR  Abd : gravid   Prenatal labs: ABO, Rh:  AB+ Antibody:  neg Rubella:  Imm / Varicella IMM RPR:   NR HBsAg:  neg  HIV:   neg GBS:   neg  Assessment/Plan: Elective repeat LTCS + BTL 03/06/20   EGA 39+1 weeks  Risks of the procedure reviewed with her . See [facility]   Ihor Austin Joshual Terrio 02/23/2020, 11:13 AM

## 2020-02-26 ENCOUNTER — Inpatient Hospital Stay: Payer: Federal, State, Local not specified - PPO | Admitting: Anesthesiology

## 2020-02-26 ENCOUNTER — Encounter: Payer: Self-pay | Admitting: Obstetrics and Gynecology

## 2020-02-26 ENCOUNTER — Inpatient Hospital Stay
Admission: EM | Admit: 2020-02-26 | Discharge: 2020-02-28 | DRG: 784 | Disposition: A | Discharge status: Home / Self Care | Payer: Federal, State, Local not specified - PPO | Attending: Obstetrics and Gynecology | Admitting: Obstetrics and Gynecology

## 2020-02-26 ENCOUNTER — Encounter
Admission: EM | Disposition: A | Discharge status: Home / Self Care | Payer: Self-pay | Source: Home / Self Care | Attending: Obstetrics and Gynecology

## 2020-02-26 ENCOUNTER — Other Ambulatory Visit: Payer: Self-pay

## 2020-02-26 DIAGNOSIS — D62 Acute posthemorrhagic anemia: Secondary | ICD-10-CM | POA: Diagnosis not present

## 2020-02-26 DIAGNOSIS — O9081 Anemia of the puerperium: Secondary | ICD-10-CM | POA: Diagnosis not present

## 2020-02-26 DIAGNOSIS — Z98891 History of uterine scar from previous surgery: Secondary | ICD-10-CM

## 2020-02-26 DIAGNOSIS — Z302 Encounter for sterilization: Secondary | ICD-10-CM | POA: Diagnosis not present

## 2020-02-26 DIAGNOSIS — O26893 Other specified pregnancy related conditions, third trimester: Secondary | ICD-10-CM | POA: Diagnosis present

## 2020-02-26 DIAGNOSIS — Z9889 Other specified postprocedural states: Secondary | ICD-10-CM

## 2020-02-26 DIAGNOSIS — Z3A37 37 weeks gestation of pregnancy: Secondary | ICD-10-CM | POA: Diagnosis not present

## 2020-02-26 DIAGNOSIS — O34211 Maternal care for low transverse scar from previous cesarean delivery: Secondary | ICD-10-CM | POA: Diagnosis present

## 2020-02-26 DIAGNOSIS — Z20822 Contact with and (suspected) exposure to covid-19: Secondary | ICD-10-CM | POA: Diagnosis present

## 2020-02-26 DIAGNOSIS — O4292 Full-term premature rupture of membranes, unspecified as to length of time between rupture and onset of labor: Principal | ICD-10-CM | POA: Diagnosis present

## 2020-02-26 LAB — BASIC METABOLIC PANEL
Anion gap: 11 (ref 5–15)
BUN: 11 mg/dL (ref 6–20)
CO2: 20 mmol/L — ABNORMAL LOW (ref 22–32)
Calcium: 8.8 mg/dL — ABNORMAL LOW (ref 8.9–10.3)
Chloride: 105 mmol/L (ref 98–111)
Creatinine, Ser: 0.56 mg/dL (ref 0.44–1.00)
GFR, Estimated: >60 mL/min (ref >60)
Glucose, Bld: 86 mg/dL (ref 70–99)
Potassium: 3.9 mmol/L (ref 3.5–5.1)
Sodium: 136 mmol/L (ref 135–145)

## 2020-02-26 LAB — CBC
HCT: 30.9 % — ABNORMAL LOW (ref 36.0–46.0)
Hemoglobin: 10.8 g/dL — ABNORMAL LOW (ref 12.0–15.0)
MCH: 30.8 pg (ref 26.0–34.0)
MCHC: 35 g/dL (ref 30.0–36.0)
MCV: 88 fL (ref 80.0–100.0)
Platelets: 235 10*3/uL (ref 150–400)
RBC: 3.51 MIL/uL — ABNORMAL LOW (ref 3.87–5.11)
RDW: 13.1 % (ref 11.5–15.5)
WBC: 12 10*3/uL — ABNORMAL HIGH (ref 4.0–10.5)
nRBC: 0 % (ref 0.0–0.2)

## 2020-02-26 LAB — TYPE AND SCREEN
ABO/RH(D): AB POS
Antibody Screen: NEGATIVE

## 2020-02-26 LAB — RUPTURE OF MEMBRANE (ROM)PLUS: Rom Plus: POSITIVE

## 2020-02-26 LAB — SARS CORONAVIRUS 2 BY RT PCR (HOSPITAL ORDER, PERFORMED IN ~~LOC~~ HOSPITAL LAB): SARS Coronavirus 2: NEGATIVE

## 2020-02-26 SURGERY — Surgical Case
Anesthesia: Spinal

## 2020-02-26 MED ORDER — DIBUCAINE (PERIANAL) 1 % EX OINT: 1.0000 "application " | TOPICAL_OINTMENT | CUTANEOUS | Status: DC | PRN

## 2020-02-26 MED ORDER — NALBUPHINE HCL 10 MG/ML IJ SOLN: 5.0000 mg | Freq: Once | INTRAMUSCULAR | Status: DC | PRN

## 2020-02-26 MED ORDER — SCOPOLAMINE 1 MG/3DAYS TD PT72
1.0000 | MEDICATED_PATCH | Freq: Once | TRANSDERMAL | Status: DC
  Filled 2020-02-26: qty 1

## 2020-02-26 MED ORDER — PHENYLEPHRINE HCL-NACL 10-0.9 MG/250ML-% IV SOLN
INTRAVENOUS | Status: DC | PRN
  Administered 2020-02-26: 50 ug/min via INTRAVENOUS

## 2020-02-26 MED ORDER — KETOROLAC TROMETHAMINE 30 MG/ML IJ SOLN
30.0000 mg | Freq: Once | INTRAMUSCULAR | Status: AC
  Administered 2020-02-26: 30 mg via INTRAVENOUS
  Filled 2020-02-26: qty 1

## 2020-02-26 MED ORDER — OXYTOCIN-SODIUM CHLORIDE 30-0.9 UT/500ML-% IV SOLN: 2.5000 [IU]/h | INTRAVENOUS | Status: AC

## 2020-02-26 MED ORDER — AMMONIA AROMATIC IN INHA
RESPIRATORY_TRACT | Status: AC
  Filled 2020-02-26: qty 10

## 2020-02-26 MED ORDER — MORPHINE SULFATE (PF) 0.5 MG/ML IJ SOLN
INTRAMUSCULAR | Status: DC | PRN
  Administered 2020-02-26: .1 mg via INTRATHECAL

## 2020-02-26 MED ORDER — ONDANSETRON HCL 4 MG/2ML IJ SOLN
INTRAMUSCULAR | Status: DC | PRN
  Administered 2020-02-26: 4 mg via INTRAVENOUS

## 2020-02-26 MED ORDER — MEPERIDINE HCL 25 MG/ML IJ SOLN: 6.2500 mg | INTRAMUSCULAR | Status: DC | PRN

## 2020-02-26 MED ORDER — SODIUM CHLORIDE FLUSH 0.9 % IV SOLN
INTRAVENOUS | Status: DC | PRN
  Administered 2020-02-26: 100 mL

## 2020-02-26 MED ORDER — LACTATED RINGERS IV SOLN: INTRAVENOUS | Status: DC

## 2020-02-26 MED ORDER — NALOXONE HCL 0.4 MG/ML IJ SOLN: 0.4000 mg | INTRAMUSCULAR | Status: DC | PRN

## 2020-02-26 MED ORDER — IBUPROFEN 800 MG PO TABS
800.0000 mg | ORAL_TABLET | Freq: Four times a day (QID) | ORAL | Status: DC
  Administered 2020-02-27 – 2020-02-28 (×3): 800 mg via ORAL
  Filled 2020-02-26 (×4): qty 1

## 2020-02-26 MED ORDER — ACETAMINOPHEN 500 MG PO TABS: 1000.0000 mg | ORAL_TABLET | Freq: Four times a day (QID) | ORAL | Status: DC

## 2020-02-26 MED ORDER — COCONUT OIL OIL: 1.0000 "application " | TOPICAL_OIL | Status: DC | PRN

## 2020-02-26 MED ORDER — DIPHENHYDRAMINE HCL 50 MG/ML IJ SOLN: 12.5000 mg | INTRAMUSCULAR | Status: DC | PRN

## 2020-02-26 MED ORDER — NALBUPHINE HCL 10 MG/ML IJ SOLN
5.0000 mg | INTRAMUSCULAR | Status: DC | PRN
Start: 2020-02-26 — End: 2020-02-28

## 2020-02-26 MED ORDER — FENTANYL CITRATE (PF) 100 MCG/2ML IJ SOLN
INTRAMUSCULAR | Status: DC | PRN
  Administered 2020-02-26: 15 ug via INTRATHECAL

## 2020-02-26 MED ORDER — NALOXONE HCL 4 MG/10ML IJ SOLN
1.0000 ug/kg/h | INTRAVENOUS | Status: DC | PRN
  Filled 2020-02-26: qty 5

## 2020-02-26 MED ORDER — WITCH HAZEL-GLYCERIN EX PADS: 1.0000 "application " | MEDICATED_PAD | CUTANEOUS | Status: DC | PRN

## 2020-02-26 MED ORDER — PHENYLEPHRINE HCL (PRESSORS) 10 MG/ML IV SOLN
INTRAVENOUS | Status: AC
  Filled 2020-02-26: qty 1

## 2020-02-26 MED ORDER — MORPHINE SULFATE (PF) 0.5 MG/ML IJ SOLN
INTRAMUSCULAR | Status: AC
  Filled 2020-02-26: qty 10

## 2020-02-26 MED ORDER — DIPHENHYDRAMINE HCL 25 MG PO CAPS: 25.0000 mg | ORAL_CAPSULE | ORAL | Status: DC | PRN

## 2020-02-26 MED ORDER — SIMETHICONE 80 MG PO CHEW
80.0000 mg | CHEWABLE_TABLET | Freq: Three times a day (TID) | ORAL | Status: DC
  Administered 2020-02-26 – 2020-02-28 (×5): 80 mg via ORAL
  Filled 2020-02-26 (×3): qty 1

## 2020-02-26 MED ORDER — SODIUM CHLORIDE 0.9 % IV SOLN: 500.0000 mg | Freq: Once | INTRAVENOUS | Status: DC

## 2020-02-26 MED ORDER — BUPIVACAINE HCL (PF) 0.5 % IJ SOLN
INTRAMUSCULAR | Status: AC
  Filled 2020-02-26: qty 30

## 2020-02-26 MED ORDER — LIDOCAINE HCL (PF) 1 % IJ SOLN
INTRAMUSCULAR | Status: DC | PRN
  Administered 2020-02-26: 2 mL via SUBCUTANEOUS

## 2020-02-26 MED ORDER — TETANUS-DIPHTH-ACELL PERTUSSIS 5-2.5-18.5 LF-MCG/0.5 IM SUSY
0.5000 mL | PREFILLED_SYRINGE | Freq: Once | INTRAMUSCULAR | Status: DC
  Filled 2020-02-26: qty 0.5

## 2020-02-26 MED ORDER — CEFAZOLIN SODIUM-DEXTROSE 2-4 GM/100ML-% IV SOLN
2.0000 g | INTRAVENOUS | Status: AC
  Administered 2020-02-26: 2 g via INTRAVENOUS
  Filled 2020-02-26: qty 100

## 2020-02-26 MED ORDER — OXYTOCIN-SODIUM CHLORIDE 30-0.9 UT/500ML-% IV SOLN
INTRAVENOUS | Status: AC
  Filled 2020-02-26: qty 1000

## 2020-02-26 MED ORDER — OXYCODONE HCL 5 MG PO TABS
5.0000 mg | ORAL_TABLET | ORAL | Status: DC | PRN
Start: 2020-02-26 — End: 2020-02-28

## 2020-02-26 MED ORDER — SOD CITRATE-CITRIC ACID 500-334 MG/5ML PO SOLN
30.0000 mL | ORAL | Status: AC
  Administered 2020-02-26: 30 mL via ORAL
  Filled 2020-02-26: qty 15

## 2020-02-26 MED ORDER — CARBOPROST TROMETHAMINE 250 MCG/ML IM SOLN
INTRAMUSCULAR | Status: AC
  Filled 2020-02-26: qty 1

## 2020-02-26 MED ORDER — KETOROLAC TROMETHAMINE 30 MG/ML IJ SOLN: 30.0000 mg | Freq: Four times a day (QID) | INTRAMUSCULAR | Status: DC

## 2020-02-26 MED ORDER — KETOROLAC TROMETHAMINE 30 MG/ML IJ SOLN
30.0000 mg | Freq: Four times a day (QID) | INTRAMUSCULAR | Status: AC
  Administered 2020-02-27 (×2): 30 mg via INTRAVENOUS
  Filled 2020-02-26 (×2): qty 1

## 2020-02-26 MED ORDER — DEXAMETHASONE SODIUM PHOSPHATE 10 MG/ML IJ SOLN
INTRAMUSCULAR | Status: AC
  Filled 2020-02-26: qty 1

## 2020-02-26 MED ORDER — MENTHOL 3 MG MT LOZG
1.0000 | LOZENGE | OROMUCOSAL | Status: DC | PRN
  Filled 2020-02-26: qty 9

## 2020-02-26 MED ORDER — MORPHINE SULFATE (PF) 2 MG/ML IV SOLN: 1.0000 mg | INTRAVENOUS | Status: DC | PRN

## 2020-02-26 MED ORDER — ONDANSETRON HCL 4 MG/2ML IJ SOLN
INTRAMUSCULAR | Status: AC
  Filled 2020-02-26: qty 2

## 2020-02-26 MED ORDER — SIMETHICONE 80 MG PO CHEW: 80.0000 mg | CHEWABLE_TABLET | ORAL | Status: DC | PRN

## 2020-02-26 MED ORDER — SENNOSIDES-DOCUSATE SODIUM 8.6-50 MG PO TABS
2.0000 | ORAL_TABLET | Freq: Every day | ORAL | Status: DC
  Administered 2020-02-27: 2 via ORAL
  Filled 2020-02-26: qty 2

## 2020-02-26 MED ORDER — DIPHENHYDRAMINE HCL 25 MG PO CAPS: 25.0000 mg | ORAL_CAPSULE | Freq: Four times a day (QID) | ORAL | Status: DC | PRN

## 2020-02-26 MED ORDER — PRENATAL MULTIVITAMIN CH
1.0000 | ORAL_TABLET | Freq: Every day | ORAL | Status: DC
  Administered 2020-02-27 – 2020-02-28 (×2): 1 via ORAL
  Filled 2020-02-26 (×2): qty 1

## 2020-02-26 MED ORDER — SODIUM CHLORIDE (PF) 0.9 % IJ SOLN
INTRAMUSCULAR | Status: AC
  Filled 2020-02-26: qty 50

## 2020-02-26 MED ORDER — ACETAMINOPHEN 500 MG PO TABS
1000.0000 mg | ORAL_TABLET | Freq: Once | ORAL | Status: AC
  Administered 2020-02-26: 1000 mg via ORAL
  Filled 2020-02-26: qty 2

## 2020-02-26 MED ORDER — DEXAMETHASONE SODIUM PHOSPHATE 10 MG/ML IJ SOLN
INTRAMUSCULAR | Status: DC | PRN
  Administered 2020-02-26: 4 mg via INTRAVENOUS

## 2020-02-26 MED ORDER — OXYTOCIN-SODIUM CHLORIDE 30-0.9 UT/500ML-% IV SOLN
INTRAVENOUS | Status: DC | PRN
  Administered 2020-02-26: 30 m[IU]/min via INTRAVENOUS

## 2020-02-26 MED ORDER — METHYLERGONOVINE MALEATE 0.2 MG/ML IJ SOLN
INTRAMUSCULAR | Status: AC
  Filled 2020-02-26: qty 1

## 2020-02-26 MED ORDER — ACETAMINOPHEN 500 MG PO TABS
1000.0000 mg | ORAL_TABLET | Freq: Four times a day (QID) | ORAL | Status: AC
  Administered 2020-02-26 – 2020-02-27 (×3): 1000 mg via ORAL
  Filled 2020-02-26 (×3): qty 2

## 2020-02-26 MED ORDER — ONDANSETRON HCL 4 MG/2ML IJ SOLN: 4.0000 mg | Freq: Three times a day (TID) | INTRAMUSCULAR | Status: DC | PRN

## 2020-02-26 MED ORDER — ZOLPIDEM TARTRATE 5 MG PO TABS: 5.0000 mg | ORAL_TABLET | Freq: Every evening | ORAL | Status: DC | PRN

## 2020-02-26 MED ORDER — NALBUPHINE HCL 10 MG/ML IJ SOLN: 5.0000 mg | INTRAMUSCULAR | Status: DC | PRN

## 2020-02-26 MED ORDER — FENTANYL CITRATE (PF) 100 MCG/2ML IJ SOLN
INTRAMUSCULAR | Status: AC
  Filled 2020-02-26: qty 2

## 2020-02-26 MED ORDER — BUPIVACAINE IN DEXTROSE 0.75-8.25 % IT SOLN
INTRATHECAL | Status: DC | PRN
  Administered 2020-02-26: 1.6 mL via INTRATHECAL

## 2020-02-26 MED ORDER — SODIUM CHLORIDE 0.9% FLUSH: 3.0000 mL | INTRAVENOUS | Status: DC | PRN

## 2020-02-26 MED ORDER — GABAPENTIN 300 MG PO CAPS
300.0000 mg | ORAL_CAPSULE | Freq: Every day | ORAL | Status: DC
  Administered 2020-02-27 (×2): 300 mg via ORAL
  Filled 2020-02-26 (×4): qty 1

## 2020-02-26 MED ORDER — BUPIVACAINE LIPOSOME 1.3 % IJ SUSP
20.0000 mL | Freq: Once | INTRAMUSCULAR | Status: DC
  Filled 2020-02-26: qty 20

## 2020-02-26 MED ORDER — ENOXAPARIN SODIUM 40 MG/0.4ML ~~LOC~~ SOLN
40.0000 mg | SUBCUTANEOUS | Status: DC
  Administered 2020-02-27 – 2020-02-28 (×2): 40 mg via SUBCUTANEOUS
  Filled 2020-02-26 (×2): qty 0.4

## 2020-02-26 MED ORDER — GABAPENTIN 300 MG PO CAPS
300.0000 mg | ORAL_CAPSULE | Freq: Once | ORAL | Status: AC
  Administered 2020-02-26: 300 mg via ORAL
  Filled 2020-02-26 (×2): qty 1

## 2020-02-26 SURGICAL SUPPLY — 29 items
BARRIER ADHS 3X4 INTERCEED (GAUZE/BANDAGES/DRESSINGS) ×2 IMPLANT
CANISTER SUCT 3000ML PPV (MISCELLANEOUS) ×2 IMPLANT
CHLORAPREP W/TINT 26 (MISCELLANEOUS) ×2 IMPLANT
COVER WAND RF STERILE (DRAPES) ×2 IMPLANT
DRSG OPSITE POSTOP 4X10 (GAUZE/BANDAGES/DRESSINGS) ×2 IMPLANT
DRSG TELFA 3X8 NADH (GAUZE/BANDAGES/DRESSINGS) ×2 IMPLANT
ELECT CAUTERY BLADE 6.4 (BLADE) ×2 IMPLANT
ELECT REM PT RETURN 9FT ADLT (ELECTROSURGICAL) ×2
ELECTRODE REM PT RTRN 9FT ADLT (ELECTROSURGICAL) ×1 IMPLANT
GAUZE SPONGE 4X4 12PLY STRL (GAUZE/BANDAGES/DRESSINGS) ×2 IMPLANT
GLOVE SURG SYN 8.0 (GLOVE) ×2 IMPLANT
GOWN STRL REUS W/ TWL LRG LVL3 (GOWN DISPOSABLE) ×2 IMPLANT
GOWN STRL REUS W/ TWL XL LVL3 (GOWN DISPOSABLE) ×1 IMPLANT
GOWN STRL REUS W/TWL LRG LVL3 (GOWN DISPOSABLE) ×2
GOWN STRL REUS W/TWL XL LVL3 (GOWN DISPOSABLE) ×1
MANIFOLD NEPTUNE II (INSTRUMENTS) ×2 IMPLANT
MAT PREVALON FULL STRYKER (MISCELLANEOUS) ×2 IMPLANT
NEEDLE HYPO 22GX1.5 SAFETY (NEEDLE) ×2 IMPLANT
NS IRRIG 1000ML POUR BTL (IV SOLUTION) ×2 IMPLANT
PACK C SECTION AR (MISCELLANEOUS) ×2 IMPLANT
PAD OB MATERNITY 4.3X12.25 (PERSONAL CARE ITEMS) ×2 IMPLANT
PAD PREP 24X41 OB/GYN DISP (PERSONAL CARE ITEMS) ×2 IMPLANT
SPONGE GAUZE 4X4 12PLY (GAUZE/BANDAGES/DRESSINGS) ×2 IMPLANT
STRAP SAFETY 5IN WIDE (MISCELLANEOUS) ×2 IMPLANT
SUT CHROMIC 1 CTX 36 (SUTURE) ×6 IMPLANT
SUT PLAIN GUT 0 (SUTURE) ×4 IMPLANT
SUT VIC AB 0 CT1 36 (SUTURE) ×4 IMPLANT
SYR 30ML LL (SYRINGE) ×4 IMPLANT
TAPE PAPER 3X10 WHT MICROPORE (GAUZE/BANDAGES/DRESSINGS) ×2 IMPLANT

## 2020-02-26 NOTE — Op Note (Signed)
NAME: [name], POPE MEDICAL RECORD UX:[id number] ACCOUNT [id number] DATE OF BIRTH:[birth year] FACILITY: [facility] LOCATION: [facility] PHYSICIAN:Lashawnda Hancox Cloyde Reams, MD  OPERATIVE REPORT  DATE OF PROCEDURE:  02/26/2020  PREOPERATIVE DIAGNOSES: 1.  37+6 weeks estimated gestational age. 2.  Previous cesarean section, desires repeat cesarean section. 3.  Desires elective permanent sterilization.  POSTOPERATIVE DIAGNOSES: 1.  37+6 weeks estimated gestational age. 2.  Previous cesarean section, desires repeat cesarean section. 3.  Desires elective permanent sterilization. 4.  Vigorous female delivered.  PROCEDURE: 1.  Repeat low transverse cesarean section. 2.  Bilateral tubal ligation -- [place].  ANESTHESIA:  Spinal.  SURGEON:  Jennell Corner, MD.  FIRST ASSISTANT:  Margaretmary Eddy, certified nurse midwife.  INDICATIONS:  A 35 year old gravida 4, para 2 patient at 37+6 weeks estimated gestational age when she had spontaneous rupture of membranes at home at 0300 on the day of the procedure.  The patient had been previously scheduled for elective repeat  cesarean section and bilateral tubal ligation.  The patient reconfirms the desire for sterilization the day of the procedure.  DESCRIPTION OF PROCEDURE:  After adequate spinal anesthesia, the patient was placed in dorsal supine position, hip rolled on the right side.  The patient's abdomen was prepped and draped in normal sterile fashion.  The patient did receive 2 grams IV  Ancef prior to commencement of the case.  Timeout was performed.  Pfannenstiel incision was made 2 fingerbreadths above the symphysis pubis.  Sharp dissection was used to identify the fascia.  Fascia was opened in the midline and opened in a transverse  fashion.  The superior aspect of the fascia was grasped with Kocher clamps and the recti muscles were dissected free.  Inferior aspect of the fascia was grasped with Kocher clamps and the pyramidalis muscle was  dissected free.  Entry into the peritoneal  cavity was accomplished sharply.  There was one abdominal adhesions tethered to the uterus that was doubly clamped, transected and suture ligated on both proximal and distal ends.  The vesicouterine peritoneal fold was then identified and a bladder flap  was created.  A low transverse uterine incision was made.  Upon the entry into the endometrial cavity, clear fluid resulted.  The incision was extended with blunt transverse traction.  Fetal head was then delivered into the incision and with fundal  pressure, the head, shoulders and body were delivered without difficulty.  A vigorous female was delivered at 12:08 on 02/26/2020, delayed cord clamping ensued for 60 seconds while surgeon and assistant dried the infant.  Cord was doubly clamped and  vigorous female was passed off to pediatric staff who assigned Apgar scores of 9 and 9.  Intravenous Pitocin was administered and the placenta was manually delivered followed by exteriorization of the uterus.  The endometrial cavity was wiped clean with  laparotomy tape.  Uterine incision was then closed with 1-0 chromic suture in a running locking fashion.  Good approximation of edges.  Good hemostasis was noted.  Attention was directed to the patient's right fallopian tube, which was picked up at the  midportion and 2 separate 0 plain gut sutures were placed and a 1.5 cm portion of fallopian tube was removed.  Similar procedure was repeated on the patient's left fallopian tube at the midportion of the fallopian tube, 2 separate 0 plain gut sutures  were placed and a 1.5 cm portion of fallopian tube was removed.  Good hemostasis was noted.  Posterior cul-de-sac was irrigated and suctioned and the uterus was placed  back into the abdominal cavity.  The pericolic gutters were wiped clean with  laparotomy tape.  Both tubal ligation sites appeared hemostatic as well as the uterine incision.  Interceed was placed over the uterine  incision in a T-shaped fashion and the fascia was then closed with 0 Vicryl suture in a running nonlocking fashion.   Two separate sutures were used.  The fascial edges were then injected with a solution of 20 mL of 1.3% Exparel plus 30 mL of 0.5% Marcaine plus 50 mL normal saline; 50 mL of this solution was injected in the fascial edges.  Subcutaneous tissues were  irrigated and bovied for hemostasis and the skin was reapproximated with Insorb absorbable staples and additional 30 mL of Exparel solution was injected beneath the skin.  COMPLICATIONS:  There were no complications.  QUANTITATIVE BLOOD LOSS:  600 mL.  INTRAOPERATIVE FLUIDS:  1300 mL.  URINE OUTPUT:  100 mL.  COMPLICATIONS:  There were no complications.  DISPOSITION:  The patient tolerated the procedure well and was taken to recovery room in good condition.  HN/NUANCE  D:02/26/2020 T:02/26/2020 JOB:[id number]

## 2020-02-26 NOTE — Transfer of Care (Signed)
Immediate Anesthesia Transfer of Care Note  Patient: [name]  Procedure(s) Performed: REPEAT CESAREAN SECTION WITH BILATERAL TUBAL LIGATION (N/A )  Patient Location: PACU and Mother/Baby  Anesthesia Type:Spinal  Level of Consciousness: awake  Airway & Oxygen Therapy: Patient Spontanous Breathing  Post-op Assessment: Report given to RN and Post -op Vital signs reviewed and stable  Post vital signs: Reviewed and stable  Last Vitals:  Vitals Value Taken Time  BP    Temp    Pulse    Resp 13 02/26/20 1258  SpO2    Vitals shown include unvalidated device data.  Last Pain:  Vitals:   02/26/20 1118  TempSrc:   PainSc: 2          Complications: No complications documented.

## 2020-02-26 NOTE — H&P (Signed)
OB History & Physical   History of Present Illness:   Chief Complaint: leakage of fluid   HPI:  [name] is a 35 y.o. [id number] female at [redacted]w[redacted]d dated by LMP of 06/06/2019, c/w [place] at [redacted]w[redacted]d.  She presents to L&D for SROM.  She had a large gush of clear fluid at 0300 today and has continued to leak.  ROM + is positive.   Reports active fetal movement  Contractions: irregular cramping  LOF/SROM: large gush of clear fluid at 0300 Vaginal bleeding: declined   Pregnancy Issues: 1. Previous LTCS x 2 2. History of depression, no current medications 3. POTS, no current medications   Patient Active Problem List   Diagnosis Date Noted  . Previous cesarean section 02/26/2020  . Encounter for supervision of normal pregnancy in third trimester 08/18/2019  . Delivery with history of cesarean section 01/05/2018  . Post-operative state 07/01/2014  . Labor and delivery, indication for care 06/29/2014  . POTS (postural orthostatic tachycardia syndrome) 11/23/2013  . Tachycardia      Maternal Medical History:   Past Medical History:  Diagnosis Date  . Orthostatic lightheadedness   . POTS (postural orthostatic tachycardia syndrome)   . Tachycardia   . Wisdom teeth extracted     Past Surgical History:  Procedure Laterality Date  . CESAREAN SECTION N/A 06/30/2014   Procedure: CESAREAN SECTION;  Surgeon: Suzy Bouchard, MD;  Location: [facility] ORS;  Service: Obstetrics;  Laterality: N/A;  . CESAREAN SECTION N/A 01/05/2018   Procedure: CESAREAN SECTION, repeat;  Surgeon: Schermerhorn, Ihor Austin, MD;  Location: [facility] ORS;  Service: Obstetrics;  Laterality: N/A;  . DILATION AND EVACUATION N/A 11/04/2016   Procedure: DILATATION AND EVACUATION;  Surgeon: Feliberto Gottron, Ihor Austin, MD;  Location: [facility] ORS;  Service: Gynecology;  Laterality: N/A;    No Known Allergies  Prior to Admission medications   Medication Sig Start Date End Date Taking? Authorizing Provider  acetaminophen (TYLENOL) 325  MG tablet Take 650 mg by mouth every 6 (six) hours as needed for moderate pain or headache.   Yes [provider]  diphenhydrAMINE (BENADRYL) 25 MG tablet Take 25 mg by mouth daily as needed for allergies.   Yes [provider]  ferrous sulfate 325 (65 FE) MG tablet Take 325 mg by mouth daily.   Yes [provider]  Prenatal Vit-Fe Fumarate-FA (PRENATAL MULTIVITAMIN) TABS tablet Take 1 tablet by mouth every evening.    Yes [provider]     Prenatal care site:  [facility]  Social History: She  reports that she has never smoked. She has never used smokeless tobacco. She reports that she does not drink alcohol and does not use drugs.  Family History: family history is not on file.   Review of Systems: A full review of systems was performed and negative except as noted in the HPI.     Physical Exam:  Vital Signs: BP 103/73 (BP Location: Left Arm)   Pulse (!) 127   Temp 98.1 F (36.7 C) (Oral)   Resp 18   Ht 5\' 4"  (1.626 m)   Wt 72.1 kg   LMP 06/06/2019   BMI 27.29 kg/m  Physical Exam  General: no acute distress.  HEENT: normocephalic, atraumatic Heart: regular rate & rhythm.  No murmurs/rubs/gallops Lungs: clear to auscultation bilaterally, normal respiratory effort Abdomen: soft, gravid, non-tender;  EFW: 7 1/2 lbs  Pelvic:   External: Normal external female genitalia  Cervix: deferred    Extremities:  non-tender, symmetric, No edema bilaterally.  DTRs: 2+/2+  Neurologic: Alert & oriented x 3.    Results for orders placed or performed during the hospital encounter of 02/26/20 (from the past 24 hour(s))  ROM Plus ([facility] only)     Status: None   Collection Time: 02/26/20  5:43 AM  Result Value Ref Range   Rom Plus POSITIVE     Pertinent Results:  Prenatal Labs: Blood type/Rh AB pos  Antibody screen neg  Rubella Immune  Varicella Immune  RPR NR  HBsAg Neg  HIV NR  GC neg  Chlamydia neg  Genetic screening cfDNA  negative, AFP neg  1 hour GTT 99  3 hour GTT N/A  GBS Neg   FHT: Baseline: 135 bpm, Variability: moderate, Accelerations: present and Decelerations: Absent TOCO: occasional, mild contractions  SVE:  Deferred    Cephalic by Leopolds   No results found.  Assessment:  [name] is a 35 y.o. [id number] female at [redacted]w[redacted]d with PROM.   Plan:  1. Admit to Labor & Delivery; consents reviewed and obtained - Covid admission screen  - Dr. Feliberto Gottron notified of admission - OR and anesthesia notified of admission   2. Fetal Well being  - Fetal Tracing: cat 1 - Group B Streptococcus ppx not indicated: Neg - Presentation: cephalic confirmed by Leopolds   3. Routine OB: - Prenatal labs reviewed, as above - Rh pos - CBC, T&S, RPR on admit - NPO, IVF  4. Repeat cesarean section - Contractions monitored with external toco - NPO status at 0400 - Plan for  continuous fetal monitoring - Maternal pain control per anesthesia  - Anticipate elective repeat LTCS and BTL with Dr. Feliberto Gottron   5. Post Partum Planning: - Infant feeding: Breast - Contraception: BTL - Tdap vaccine: given 12/20/2019 - Flu vaccine: Given 10/25/2019  Gustavo Lah, CNM 02/26/20 6:50 AM  Margaretmary Eddy, CNM Certified Nurse Midwife [facility]

## 2020-02-26 NOTE — Anesthesia Procedure Notes (Signed)
Spinal  Patient location during procedure: OR Start time: 02/26/2020 11:42 AM End time: 02/26/2020 11:45 AM Staffing Performed: anesthesiologist  Anesthesiologist: Karleen Hampshire, MD Resident/CRNA: Jeronimo Norma, CRNA Preanesthetic Checklist Completed: patient identified, IV checked, site marked, risks and benefits discussed, surgical consent, monitors and equipment checked, pre-op evaluation and timeout performed Spinal Block Patient position: sitting Prep: ChloraPrep Patient monitoring: heart rate, cardiac monitor, continuous pulse ox and blood pressure Approach: midline Location: L4-5 Injection technique: single-shot Needle Needle type: Sprotte  Needle gauge: 24 G Needle length: 9 cm

## 2020-02-26 NOTE — Brief Op Note (Signed)
02/26/2020  12:47 PM  PATIENT:  [name]  35 y.o. female  PRE-OPERATIVE DIAGNOSIS:  elective repeat cesarean with Bilateral Tubal Ligation  POST-OPERATIVE DIAGNOSIS:  elective repeat cesarean with Bilateral Tubal Ligation  PROCEDURE:  Procedure(s): REPEAT CESAREAN SECTION WITH BILATERAL TUBAL LIGATION (N/A)  SURGEON:  Surgeon(s) and Role:    * Berdena Cisek, Ihor Austin, MD - Primary  PHYSICIAN ASSISTANT: Margaretmary Eddy , CNM   ASSISTANTS: none   ANESTHESIA:   spinal  EBL:  QBL :  600 mL   BLOOD ADMINISTERED:none  DRAINS: Urinary Catheter (Foley)   LOCAL MEDICATIONS USED:  MARCAINE    and BUPIVICAINE   SPECIMEN:  Source of Specimen:  portion right and left fallopian tube   DISPOSITION OF SPECIMEN:  PATHOLOGY  COUNTS:  YES  TOURNIQUET:  * No tourniquets in log *  DICTATION: .Other Dictation: Dictation Number verbal  PLAN OF CARE: Admit to inpatient   PATIENT DISPOSITION:  PACU - hemodynamically stable.   Delay start of Pharmacological VTE agent (>24hrs) due to surgical blood loss or risk of bleeding: not applicable

## 2020-02-26 NOTE — Progress Notes (Signed)
Patient ID: [name], female   DOB: Aug 03, 1985, 35 y.o.   MRN: [id number] Pt is 37+6 weeks and SROm at 0300 this am . Pt is scheduled for repeat LTCS + btl . She reconfirms her desire for elective sterilization . All questions answered .

## 2020-02-26 NOTE — OB Triage Note (Addendum)
[facility] to [facility] with complaints of LOF. Pt is a G3P2. Pt has had 2 c-sections in the past and is scheduled for a repeat on 1/31 with a tubal. Pt states that she has been leaking fluid since about 0300 this morning and it has been clear. Pt denies vaginal bleeding and N/V. Pt states that she had ctx yesterday but they were irregular. Pt states positive FM. Monitors applied and assessing.

## 2020-02-26 NOTE — Anesthesia Preprocedure Evaluation (Addendum)
Anesthesia Evaluation  Patient identified by MRN, date of birth, ID band Patient awake    Reviewed: Allergy & Precautions, H&P , NPO status , Patient's Chart, lab work & pertinent test results  Airway Mallampati: II  TM Distance: >3 FB Neck ROM: full    Dental   Pulmonary neg pulmonary ROS,           Cardiovascular Exercise Tolerance: Good (-) hypertension Rhythm:regular  POTS   Neuro/Psych    GI/Hepatic negative GI ROS,   Endo/Other    Renal/GU   negative genitourinary   Musculoskeletal   Abdominal   Peds  Hematology negative hematology ROS (+)   Anesthesia Other Findings Past Medical History: No date: Orthostatic lightheadedness No date: POTS (postural orthostatic tachycardia syndrome) No date: Tachycardia No date: Wisdom teeth extracted  Past Surgical History: 06/30/2014: CESAREAN SECTION; N/A     Comment:  Procedure: CESAREAN SECTION;  Surgeon: Suzy Bouchard, MD;  Location: [facility] ORS;  Service:               Obstetrics;  Laterality: N/A; 01/05/2018: CESAREAN SECTION; N/A     Comment:  Procedure: CESAREAN SECTION, repeat;  Surgeon:               Schermerhorn, Ihor Austin, MD;  Location: [facility] ORS;                Service: Obstetrics;  Laterality: N/A; 11/04/2016: DILATION AND EVACUATION; N/A     Comment:  Procedure: DILATATION AND EVACUATION;  Surgeon:               Schermerhorn, Ihor Austin, MD;  Location: [facility] ORS;                Service: Gynecology;  Laterality: N/A;  BMI    Body Mass Index: 27.29 kg/m      Reproductive/Obstetrics (+) Pregnancy                            Anesthesia Physical Anesthesia Plan  ASA: II  Anesthesia Plan: Spinal   Post-op Pain Management:    Induction:   PONV Risk Score and Plan:   Airway Management Planned:   Additional Equipment:   Intra-op Plan:   Post-operative Plan:   Informed Consent: I have reviewed the  patients History and Physical, chart, labs and discussed the procedure including the risks, benefits and alternatives for the proposed anesthesia with the patient or authorized representative who has indicated his/her understanding and acceptance.     Dental Advisory Given  Plan Discussed with: Anesthesiologist  Anesthesia Plan Comments:         Anesthesia Quick Evaluation

## 2020-02-26 NOTE — Discharge Summary (Shared)
Obstetrical Discharge Summary  Patient Name: [name] DOB: 16-Jan-1986 MRN: [id number]  Date of Admission: 02/26/2020 Date of Delivery: *** Delivered by: Dr. Beverly Gust  Date of Discharge: 02/26/2020  Primary OB: [facility] TFT:[id number] last menstrual period was 06/06/2019. EDC Estimated Date of Delivery: 03/12/20 Gestational Age at Delivery: [redacted]w[redacted]d   Antepartum complications:  1. Previous LTCS x 2 2. History of depression, no current medications  3. POTS, o current medications   Admitting Diagnosis: PROM, elective repeat c/section  Secondary Diagnosis: Patient Active Problem List   Diagnosis Date Noted  . Previous cesarean section 02/26/2020  . Encounter for supervision of normal pregnancy in third trimester 08/18/2019  . Delivery with history of cesarean section 01/05/2018  . Post-operative state 07/01/2014  . Labor and delivery, indication for care 06/29/2014  . POTS (postural orthostatic tachycardia syndrome) 11/23/2013  . Tachycardia     Augmentation: N/A Complications: {OB Labor/Delivery Complications:20784} Intrapartum complications/course: [name] presented to L&D with complaints of leaking clear fluid.  She was grossly ruptured and ROM + was positive.  She desired an elective repeat c/section with BTL.  Please see OP note and delivery summary for further details.   Delivery Type: {delivery type:32078} Anesthesia: Spinal*** Placenta: Manual removal  Laceration: none  Episiotomy: none Newborn Data: [name] "***" Weight: ***lbs ***oz, ***grams APGARS: ***/***   Postpartum Procedures: *** Edinburgh:  Edinburgh Postnatal Depression Scale Screening Tool 01/06/2018  I have been able to laugh and see the funny side of things. 0  I have looked forward with enjoyment to things. 0  I have blamed myself unnecessarily when things went wrong. 0  I have been anxious or worried for no good reason. 0  I have felt scared or panicky for no  good reason. 0  Things have been getting on top of me. 0  I have been so unhappy that I have had difficulty sleeping. 0  I have felt sad or miserable. 0  I have been so unhappy that I have been crying. 0  The thought of harming myself has occurred to me. 0  Edinburgh Postnatal Depression Scale Total 0     Post partum course: *** Patient had an uncomplicated postpartum course.  By time of discharge on POD#***, her pain was controlled on oral pain medications; she had appropriate lochia and was ambulating, voiding without difficulty, tolerating regular diet and passing flatus.   She was deemed stable for discharge to home.    Discharge Physical Exam: *** BP 103/73 (BP Location: Left Arm)   Pulse (!) 127   Temp 98.1 F (36.7 C) (Oral)   Resp 18   Ht 5\' 4"  (1.626 m)   Wt 72.1 kg   LMP 06/06/2019   BMI 27.29 kg/m   General: NAD CV: RRR Pulm: CTABL, nl effort ABD: s/nd/nt, fundus firm and below the umbilicus Lochia: moderate Perineum:***minimal edema/{OB Perineal assessment:24215} Incision: c/d/I*** DVT Evaluation: LE non-ttp, no evidence of DVT on exam.  Hemoglobin  Date Value Ref Range Status  02/01/2018 10.9 (L) 12.0 - 15.0 g/dL Final   HCT  Date Value Ref Range Status  02/01/2018 33.8 (L) 36.0 - 46.0 % Final     Disposition: stable, discharge to home. Kristine Feeding: breastmilk***formula Kristine Disposition: home with mom  Rh Immune globulin given: Rh pos Rubella vaccine given: Immune Varivax vaccine given: Immune Flu vaccine given in AP or PP setting: given 10/25/19 Tdap vaccine given in AP or PP setting: given 12/20/2019  Contraception: BTL  Prenatal Labs:  Blood type/Rh AB Pos   Antibody screen neg  Rubella Immune  Varicella Immune  RPR NR  HBsAg Neg  HIV NR  GC neg  Chlamydia neg  Genetic screening cfDNA negative, AFP negative  1 hour GTT 99  3 hour GTT N/A  GBS Negative    Plan:  [name] was discharged to home in good  condition. Follow-up appointment with delivering provider in 2 weeks for incision check.  Discharge Medications: Allergies as of 02/26/2020   No Known Allergies   Med Rec must be completed prior to using this SMARTLINK***         Signed: *** Hit refresh and delete this line

## 2020-02-26 NOTE — Discharge Summary (Signed)
Obstetrical Discharge Summary  Patient Name: [name] DOB: 1985/07/27 MRN: [id number]  Date of Admission: 02/26/2020 Date of Delivery: 02/26/20 Delivered by: Beverly Gust MD Date of Discharge: 02/28/2020  Primary OB: [facility] [id number] last menstrual period was 06/06/2019. EDC Estimated Date of Delivery: 03/12/20 Gestational Age at Delivery: [redacted]w[redacted]d   Antepartum complications: as below  Admitting Diagnosis: SROm at 37+6 weeks , previous c/s  Secondary Diagnosis: Patient Active Problem List   Diagnosis Date Noted  . Previous cesarean section 02/26/2020  . Postoperative state 02/26/2020  . Encounter for supervision of normal pregnancy in third trimester 08/18/2019  . Delivery with history of cesarean section 01/05/2018  . Post-operative state 07/01/2014  . Labor and delivery, indication for care 06/29/2014  . POTS (postural orthostatic tachycardia syndrome) 11/23/2013  . Tachycardia     Augmentation: N/A Complications: None Intrapartum complications/course:  Date of Delivery: 02/26/20 Delivered By: Beverly Gust MD Delivery Type: repeat cesarean section, low transverse incision Anesthesia: spinal Placenta: spontaneous Laceration:  Episiotomy: none Newborn Data: Live born  Female  Birth Weight:  3400 gm APGAR: ,9/9   Newborn Delivery   Birth date/time:  Delivery type:         Postpartum Procedures: none  Edinburgh:  Edinburgh Postnatal Depression Scale Screening Tool 01/06/2018  I have been able to laugh and see the funny side of things. 0  I have looked forward with enjoyment to things. 0  I have blamed myself unnecessarily when things went wrong. 0  I have been anxious or worried for no good reason. 0  I have felt scared or panicky for no good reason. 0  Things have been getting on top of me. 0  I have been so unhappy that I have had difficulty sleeping. 0  I have felt sad or miserable. 0  I have been so unhappy that I have been  crying. 0  The thought of harming myself has occurred to me. 0  Edinburgh Postnatal Depression Scale Total 0    Post partum course: uncomplicated  Patient had an uncomplicated postpartum course.  By time of discharge on POD#2, her pain was controlled on oral pain medications; she had appropriate lochia and was ambulating, voiding without difficulty, tolerating regular diet and passing flatus.   She was deemed stable for discharge to home.    Discharge Physical Exam:  BP 99/73 (BP Location: Left Arm)   Pulse 84   Temp 98.4 F (36.9 C) (Oral)   Resp 18   Ht 5\' 4"  (1.626 m)   Wt 72.1 kg   LMP 06/06/2019   SpO2 99%   Breastfeeding Unknown   BMI 27.29 kg/m   General: NAD CV: RRR Pulm: CTABL, nl effort ABD: s/nd/nt, fundus firm and below the umbilicus Lochia: moderate Incision: c/d/i DVT Evaluation: LE non-ttp, no evidence of DVT on exam.  Hemoglobin  Date Value Ref Range Status  02/27/2020 9.6 (L) 12.0 - 15.0 g/dL Final   HCT  Date Value Ref Range Status  02/27/2020 27.8 (L) 36.0 - 46.0 % Final     Disposition: stable, discharge to home. Baby Feeding: breastmilk Baby Disposition: home with mom  Rh Immune globulin given: n/a Rubella vaccine given: n/a  Tdap vaccine given in AP or PP setting: Given 12/20/19 Flu vaccine given in AP or PP setting: Given 10/25/19  Contraception: btl completed   Prenatal Labs:  Blood type/Rh AB pos  Antibody screen neg  Rubella Immune  Varicella Immune  RPR NR  HBsAg Neg  HIV NR  GC neg  Chlamydia neg  Genetic screening cfDNA negative, AFP neg  1 hour GTT 99  3 hour GTT N/A  GBS Neg       Plan:  [name] was discharged to home in good condition. Follow-up appointment with delivering provider in 6 weeks.  Discharge Medications: Allergies as of 02/28/2020   No Known Allergies     Medication List    TAKE these medications   acetaminophen 325 MG tablet Commonly known as: TYLENOL Take 650 mg by mouth every  6 (six) hours as needed for moderate pain or headache.   diphenhydrAMINE 25 MG tablet Commonly known as: BENADRYL Take 25 mg by mouth daily as needed for allergies.   ferrous sulfate 325 (65 FE) MG tablet Take 325 mg by mouth daily.   oxyCODONE 5 MG immediate release tablet Commonly known as: Oxy IR/ROXICODONE Take 1-2 tablets (5-10 mg total) by mouth every 4 (four) hours as needed for moderate pain.   prenatal multivitamin Tabs tablet Take 1 tablet by mouth every evening.        Follow-up Information    [facility]. Schedule an appointment as soon as possible for a visit in 2 week(s).   Contact information: [address] [place] [address] [phone]              Signed: Cyril Mourning 02/28/2020 8:28 AM

## 2020-02-26 NOTE — Progress Notes (Signed)
Advanced diet to soft. Pt has tolerated clear liquids, jello and graham crackers with juice.

## 2020-02-27 LAB — CBC
HCT: 27.8 % — ABNORMAL LOW (ref 36.0–46.0)
Hemoglobin: 9.6 g/dL — ABNORMAL LOW (ref 12.0–15.0)
MCH: 30.8 pg (ref 26.0–34.0)
MCHC: 34.5 g/dL (ref 30.0–36.0)
MCV: 89.1 fL (ref 80.0–100.0)
Platelets: 204 10*3/uL (ref 150–400)
RBC: 3.12 MIL/uL — ABNORMAL LOW (ref 3.87–5.11)
RDW: 13.1 % (ref 11.5–15.5)
WBC: 13.2 10*3/uL — ABNORMAL HIGH (ref 4.0–10.5)
nRBC: 0 % (ref 0.0–0.2)

## 2020-02-27 MED ORDER — ACETAMINOPHEN 500 MG PO TABS
ORAL_TABLET | ORAL | Status: AC
  Filled 2020-02-27: qty 2

## 2020-02-27 MED ORDER — FERROUS SULFATE 325 (65 FE) MG PO TABS
325.0000 mg | ORAL_TABLET | Freq: Two times a day (BID) | ORAL | Status: DC
  Administered 2020-02-27 – 2020-02-28 (×2): 325 mg via ORAL
  Filled 2020-02-27 (×2): qty 1

## 2020-02-27 MED ORDER — ACETAMINOPHEN 500 MG PO TABS
1000.0000 mg | ORAL_TABLET | Freq: Four times a day (QID) | ORAL | Status: DC
  Administered 2020-02-27 – 2020-02-28 (×2): 1000 mg via ORAL
  Filled 2020-02-27: qty 2

## 2020-02-27 NOTE — Progress Notes (Signed)
Foley cath removed, 1150 mL output. Pt stood at bedside for several minutes, took a few steps around the bed. Denies dizziness, minimal pain, tolerated activity well. Pt call for assistance with ambulation, BR, etc.

## 2020-02-27 NOTE — Anesthesia Post-op Follow-up Note (Signed)
  Anesthesia Pain Follow-up Note  Patient: [name]  Day #: 1  Date of Follow-up: 02/27/2020 Time: 10:28 PM  Last Vitals:  Vitals:   02/27/20 0858 02/27/20 1134  BP: (!) 94/55 90/61  Pulse: 92 86  Resp:  18  Temp: 36.9 C 36.8 C  SpO2: 99% 99%    Level of Consciousness: alert  Pain: none   Side Effects:None  Catheter Site Exam:clean, dry, no drainage  Anti-Coag Meds (From admission, onward)   Start     Dose/Rate Route Frequency Ordered Stop   02/27/20 1000  enoxaparin (LOVENOX) injection 40 mg        40 mg Subcutaneous Every 24 hours 02/26/20 1257         Plan: D/C from anesthesia care at surgeon's request  Dominik Yordy

## 2020-02-27 NOTE — Progress Notes (Signed)
Post OP Day 1  Subjective: no complaints and tolerating PO  Doing well, no concerns. Resting in bed, pain managed with PO/IV meds, tolerating regular diet, and foley catheter remains in place.  No fever/chills, chest pain, shortness of breath, nausea/vomiting, or leg pain. No nipple or breast pain. No headache, visual changes, or RUQ/epigastric pain.  Objective: BP (!) 94/55   Pulse 92   Temp 98.4 F (36.9 C) (Oral)   Resp 20   Ht 5\' 4"  (1.626 m)   Wt 72.1 kg   LMP 06/06/2019   SpO2 99%   Breastfeeding Unknown   BMI 27.29 kg/m    Physical Exam:  General: alert, cooperative, no distress and pale Breasts: soft/nontender CV: RRR Pulm: nl effort, CTABL Abdomen: soft, non-tender, active bowel sounds Uterine Fundus: firm Incision: no significant drainage Perineum: minimal edema, intact Lochia: appropriate DVT Evaluation: No evidence of DVT seen on physical exam.  Recent Labs    02/26/20 0751 02/27/20 0419  HGB 10.8* 9.6*  HCT 30.9* 27.8*  WBC 12.0* 13.2*  PLT 235 204    Assessment/Plan: 35 y.o. 02/29/20 postpartum day # 1  -Continue routine postpartum care -Lactation consult PRN for breastfeeding  -Acute blood loss anemia - hemodynamically stable and asymptomatic; start PO ferrous sulfate BID with stool softeners  -Plan to d/c foley catheter and ambulate in room.    Disposition: Continue inpatient postpartum care    LOS: 1 day   [id number], [name] 02/27/2020, 11:01 AM   ----- 02/29/2020  Certified Nurse Midwife [facility]

## 2020-02-27 NOTE — Anesthesia Postprocedure Evaluation (Signed)
Anesthesia Post Note  Patient: [name]  Procedure(s) Performed: REPEAT CESAREAN SECTION WITH BILATERAL TUBAL LIGATION (N/A )  Patient location during evaluation: Mother Baby Anesthesia Type: Spinal Level of consciousness: awake and alert and oriented Pain management: pain level controlled Vital Signs Assessment: post-procedure vital signs reviewed and stable Respiratory status: spontaneous breathing Cardiovascular status: blood pressure returned to baseline and stable Postop Assessment: no headache and no backache Anesthetic complications: no   No complications documented.   Last Vitals:  Vitals:   02/27/20 0858 02/27/20 1134  BP: (!) 94/55 90/61  Pulse: 92 86  Resp:  18  Temp: 36.9 C 36.8 C  SpO2: 99% 99%    Last Pain:  Vitals:   02/27/20 2010  TempSrc:   PainSc: 1                  Kenzo Ozment

## 2020-02-27 NOTE — Lactation Note (Signed)
This note was copied from a baby's chart. Lactation Consultation Note  Patient Name: [name] Date: 02/27/2020 Reason for consult: Initial assessment Age:35 hours  Lactation called to the room for assistance with bottle feeding.Baby is formula feeding. Baby was spitty overnight, remains in the first 48 HOL, C-section delivery, and 37 weeker. LC removed swaddle, burped baby, placed him on his left side. Encouraged to hold bottle level and only half way fill nipple with milk. We are wanting baby to suck and pull the milk. He took several tries but eventually had a rhythmic sucking pattern. He is not very eager at this time. Quiet and content. He took about 95ml's. He did spit milk out a couple of times while feeding. LC encouraged use of the yellow/slower flow nipple now that he is sucking and pulling the milk. Encouraged paced feed. Discussed drying up supply with Mother. No further questions at this time.   Maternal Data Formula Feeding for Exclusion: Yes Reason for exclusion: Mother's choice to formula feed on admision Does the patient have breastfeeding experience prior to this delivery?: Yes (mom does not wish to breastfeed)  Feeding Feeding Type: Bottle Fed - Formula Nipple Type: Regular  Interventions Interventions: Ice (Discussed drying up supply, not breastfeeding)  Lactation Tools Discussed/Used Tools: Bottle   Consult Status Consult Status: PRN    Baudelia Schroepfer D Muir Shellhammer 02/27/2020, 10:30 AM

## 2020-02-28 ENCOUNTER — Other Ambulatory Visit: Admission: RE | Admit: 2020-02-28 | Payer: Federal, State, Local not specified - PPO | Source: Ambulatory Visit

## 2020-02-28 ENCOUNTER — Encounter: Payer: Self-pay | Admitting: Obstetrics and Gynecology

## 2020-02-28 MED ORDER — IBUPROFEN 800 MG PO TABS
800.0000 mg | ORAL_TABLET | Freq: Four times a day (QID) | ORAL | Status: DC
  Administered 2020-02-28: 800 mg via ORAL
  Filled 2020-02-28: qty 1

## 2020-02-28 MED ORDER — ACETAMINOPHEN 500 MG PO TABS
1000.0000 mg | ORAL_TABLET | Freq: Four times a day (QID) | ORAL | Status: DC
  Administered 2020-02-28: 1000 mg via ORAL
  Filled 2020-02-28: qty 2

## 2020-02-28 MED ORDER — OXYCODONE HCL 5 MG PO TABS
5.0000 mg | ORAL_TABLET | ORAL | 0 refills | Status: AC | PRN
Start: 2020-02-28 — End: ?

## 2020-02-28 NOTE — Discharge Instructions (Signed)
  Postpartum Care After Cesarean Delivery This sheet gives you information about how to care for yourself from the time you deliver your baby to up to 6-12 weeks after delivery (postpartum period). Your health care provider may also give you more specific instructions. If you have problems or questions, contact your health care provider. Follow these instructions at home: Medicines  Take over-the-counter and prescription medicines only as told by your health care provider.  If you were prescribed an antibiotic medicine, take it as told by your health care provider. Do not stop taking the antibiotic even if you start to feel better.  Ask your health care provider if the medicine prescribed to you: ? Requires you to avoid driving or using heavy machinery. ? Can cause constipation. You may need to take actions to prevent or treat constipation, such as:  Drink enough fluid to keep your urine pale yellow.  Take over-the-counter or prescription medicines.  Eat foods that are high in fiber, such as beans, whole grains, and fresh fruits and vegetables.  Limit foods that are high in fat and processed sugars, such as fried or sweet foods. Activity  Gradually return to your normal activities as told by your health care provider.  Avoid activities that take a lot of effort and energy (are strenuous) until approved by your health care provider. Walking at a slow to moderate pace is usually safe. Ask your health care provider what activities are safe for you. ? Do not lift anything that is heavier than your baby or 10 lb (4.5 kg) as told by your health care provider. ? Do not vacuum, climb stairs, or drive a car for as long as told by your health care provider.  If possible, have someone help you at home until you are able to do your usual activities yourself.  Rest as much as possible. Try to rest or take naps while your baby is sleeping. Vaginal bleeding  It is normal to have vaginal  bleeding (lochia) after delivery. Wear a sanitary pad to absorb vaginal bleeding and discharge. ? During the first week after delivery, the amount and appearance of lochia is often similar to a menstrual period. ? Over the next few weeks, it will gradually decrease to a dry, yellow-brown discharge. ? For most women, lochia stops completely by 4-6 weeks after delivery. Vaginal bleeding can vary from woman to woman.  Change your sanitary pads frequently. Watch for any changes in your flow, such as: ? A sudden increase in volume. ? A change in color. ? Large blood clots.  If you pass a blood clot, save it and call your health care provider to discuss. Do not flush blood clots down the toilet before you get instructions from your health care provider.  Do not use tampons or douches until your health care provider says this is safe.  If you are not breastfeeding, your period should return 6-8 weeks after delivery. If you are breastfeeding, your period may return anytime between 8 weeks after delivery and the time that you stop breastfeeding. Perineal care  If your C-section (Cesarean section) was unplanned, and you were allowed to labor and push before delivery, you may have pain, swelling, and discomfort of the tissue between your vaginal opening and your anus (perineum). You may also have an incision in the tissue (episiotomy) or the tissue may have torn during delivery. Follow these instructions as told by your health care provider: ? Keep your perineum clean and dry as told   by your health care provider. Use medicated pads and pain-relieving sprays and creams as directed. ? If you have an episiotomy or vaginal tear, check the area every day for signs of infection. Check for:  Redness, swelling, or pain.  Fluid or blood.  Warmth.  Pus or a bad smell. ? You may be given a squirt bottle to use instead of wiping to clean the perineum area after you go to the bathroom. As you start healing, you  may use the squirt bottle before wiping yourself. Make sure to wipe gently. ? To relieve pain caused by an episiotomy, vaginal tear, or hemorrhoids, try taking a warm sitz bath 2-3 times a day. A sitz bath is a warm water bath that is taken while you are sitting down. The water should only come up to your hips and should cover your buttocks.   Breast care  Within the first few days after delivery, your breasts may feel heavy, full, and uncomfortable (breast engorgement). You may also have milk leaking from your breasts. Your health care provider can suggest ways to help relieve breast discomfort. Breast engorgement should go away within a few days.  If you are breastfeeding: ? Wear a bra that supports your breasts and fits you well. ? Keep your nipples clean and dry. Apply creams and ointments as told by your health care provider. ? You may need to use breast pads to absorb milk leakage. ? You may have uterine contractions every time you breastfeed for several weeks after delivery. Uterine contractions help your uterus return to its normal size. ? If you have any problems with breastfeeding, work with your health care provider or a lactation consultant.  If you are not breastfeeding: ? Avoid touching your breasts as this can make your breasts produce more milk. ? Wear a well-fitting bra and use cold packs to help with swelling. ? Do not squeeze out (express) milk. This causes you to make more milk. Intimacy and sexuality  Ask your health care provider when you can engage in sexual activity. This may depend on your: ? Risk of infection. ? Healing rate. ? Comfort and desire to engage in sexual activity.  You are able to get pregnant after delivery, even if you have not had your period. If desired, talk with your health care provider about methods of family planning or birth control (contraception). Lifestyle  Do not use any products that contain nicotine or tobacco, such as cigarettes,  e-cigarettes, and chewing tobacco. If you need help quitting, ask your health care provider.  Do not drink alcohol, especially if you are breastfeeding. Eating and drinking  Drink enough fluid to keep your urine pale yellow.  Eat high-fiber foods every day. These may help prevent or relieve constipation. High-fiber foods include: ? Whole grain cereals and breads. ? Brown rice. ? Beans. ? Fresh fruits and vegetables.  Take your prenatal vitamins until your postpartum checkup or until your health care provider tells you it is okay to stop.   General instructions  Keep all follow-up visits for you and your baby as told by your health care provider. Most women visit their health care provider for a postpartum checkup within the first 3-6 weeks after delivery. Contact a health care provider if you:  Feel unable to cope with the changes that a new baby brings to your life, and these feelings do not go away.  Feel unusually sad or worried.  Have breasts that are painful, hard, or turn red.    Have a fever.  Have trouble holding urine or keeping urine from leaking.  Have little or no interest in activities you used to enjoy.  Have not breastfed at all and you have not had a menstrual period for 12 weeks after delivery.  Have stopped breastfeeding and you have not had a menstrual period for 12 weeks after you stopped breastfeeding.  Have questions about caring for yourself or your baby.  Pass a blood clot from your vagina. Get help right away if you:  Have chest pain.  Have difficulty breathing.  Have sudden, severe leg pain.  Have severe pain or cramping in your abdomen.  Bleed from your vagina so much that you fill more than one sanitary pad in one hour. Bleeding should not be heavier than your heaviest period.  Develop a severe headache.  Faint.  Have blurred vision or spots in your vision.  Have a bad-smelling vaginal discharge.  Have thoughts about hurting yourself  or your baby. If you ever feel like you may hurt yourself or others, or have thoughts about taking your own life, get help right away. You can go to your nearest emergency department or call:  Your local emergency services (911 in the [place]  A suicide crisis helpline, such as the National Suicide Prevention Lifeline at 1-800-273-8255. This is open 24 hours a day. Summary  The period of time from when you deliver your baby to up to 6-12 weeks after delivery is called the postpartum period.  Gradually return to your normal activities as told by your health care provider.  Keep all follow-up visits for you and your baby as told by your health care provider. This information is not intended to replace advice given to you by your health care provider. Make sure you discuss any questions you have with your health care provider. Document Revised: 09/10/2017 Document Reviewed: 09/10/2017 Elsevier Patient Education  2021 Elsevier Inc.   Postpartum Baby Blues The postpartum period begins right after the birth of a baby. During this time, there is often joy and excitement. It is also a time of many changes in the life of the parents. A mother may feel happy one minute and sad or stressed the next. These feelings of sadness, called the baby blues, usually happen in the period right after the baby is born and go away within a week or two. What are the causes? The exact cause of this condition is not known. Changes in hormone levels after childbirth are believed to trigger some of the symptoms. Other factors that can play a role in these mood changes include:  Lack of sleep.  Stressful life events, such as financial problems, caring for a loved one, or death of a loved one.  Genetics. What are the signs or symptoms? Symptoms of this condition include:  Changes in mood, such as going from extreme happiness to sadness.  A decrease in concentration.  Difficulty sleeping.  Crying spells and  tearfulness.  Loss of appetite.  Irritability.  Anxiety. If these symptoms last for more than 2 weeks or become more severe, you may have postpartum depression. How is this diagnosed? This condition is diagnosed based on an evaluation of your symptoms. Your health care provider may use a screening tool that includes a list of questions to help identify a person with the baby blues or postpartum depression. How is this treated? The baby blues usually go away on their own in 1-2 weeks. Social support is often what is needed.   You will be encouraged to get adequate sleep and rest. Follow these instructions at home: Lifestyle  Get as much rest as you can. Take a nap when the baby sleeps.  Exercise regularly as told by your health care provider. Some women find yoga and walking to be helpful.  Eat a balanced and nourishing diet. This includes plenty of fruits and vegetables, whole grains, and lean proteins.  Do little things that you enjoy. Take a bubble bath, read your favorite magazine, or listen to your favorite music.  Avoid alcohol.  Ask for help with household chores, cooking, grocery shopping, or running errands. Do not try to do everything yourself. Consider hiring a postpartum doula to help. This is a professional who specializes in providing support to new mothers.  Try not to make any major life changes during pregnancy or right after giving birth. This can add stress.      General instructions  Talk to people close to you about how you are feeling. Get support from your partner, family members, friends, or other new moms. You may want to join a support group.  Find ways to manage stress. This may include: ? Writing your thoughts and feelings in a journal. ? Spending time outside. ? Spending time with people who make you laugh.  Try to stay positive in how you think. Think about the things you are grateful for.  Take over-the-counter and prescription medicines only as  told by your health care provider.  Let your health care provider know if you have any concerns.  Keep all postpartum visits. This is important. Contact a health care provider if:  Your baby blues do not go away after 2 weeks. Get help right away if:  You have thoughts of taking your own life (suicidal thoughts), or of harming your baby or someone else.  You see or hear things that are not there (hallucinations). If you ever feel like you may hurt yourself or others, or have thoughts about taking your own life, get help right away. Go to your nearest emergency department or:  Call your local emergency services (911 in the [place]  Call a suicide crisis helpline, such as the National Suicide Prevention Lifeline, at 1-800-273-8255. This is open 24 hours a day in the [place]  Text the Crisis Text Line at 741741 (in the [place] Summary  After giving birth, you may feel happy one minute and sad or stressed the next. Feelings of sadness that happen right after the baby is born and go away after a week or two are called the baby blues.  You can manage the baby blues by getting enough rest, eating a healthy diet, exercising, spending time with supportive people, and finding ways to manage stress.  If feelings of sadness and stress last longer than 2 weeks or get in the way of caring for your baby, talk with your health care provider. This may mean you have postpartum depression. This information is not intended to replace advice given to you by your health care provider. Make sure you discuss any questions you have with your health care provider. Document Revised: 07/16/2019 Document Reviewed: 07/16/2019 Elsevier Patient Education  2021 Elsevier Inc.  

## 2020-02-28 NOTE — Progress Notes (Signed)
Pt discharged with infant. Discharge instructions, prescriptions, and follow up appointments given to and reviewed with patient. Pt verbalized understanding. To be escorted out by auxillary.  °

## 2020-02-29 LAB — SURGICAL PATHOLOGY

## 2020-03-03 ENCOUNTER — Other Ambulatory Visit: Payer: Federal, State, Local not specified - PPO

## 2020-03-06 ENCOUNTER — Inpatient Hospital Stay
Admission: RE | Admit: 2020-03-06 | Payer: Federal, State, Local not specified - PPO | Source: Home / Self Care | Admitting: Obstetrics and Gynecology

## 2022-03-06 ENCOUNTER — Ambulatory Visit
Admission: EM | Admit: 2022-03-06 | Discharge: 2022-03-06 | Disposition: A | Discharge status: Home / Self Care | Payer: Federal, State, Local not specified - PPO | Attending: Family Medicine | Admitting: Family Medicine

## 2022-03-06 DIAGNOSIS — J020 Streptococcal pharyngitis: Secondary | ICD-10-CM | POA: Diagnosis not present

## 2022-03-06 LAB — RAPID INFLUENZA A&B ANTIGENS
Influenza A (ARMC): NEGATIVE
Influenza B (ARMC): NEGATIVE

## 2022-03-06 LAB — GROUP A STREP BY PCR: Group A Strep by PCR: DETECTED — AB

## 2022-03-06 MED ORDER — AMOXICILLIN 875 MG PO TABS: 875.0000 mg | ORAL_TABLET | Freq: Two times a day (BID) | ORAL | 0 refills | Status: AC

## 2022-03-06 NOTE — ED Triage Notes (Signed)
Pt c/o sore throat x1 day, & nasal congestion,cough x2 days & L ear pain x1 mon. Husband & daughter both tested + for flu.

## 2022-03-06 NOTE — Discharge Instructions (Addendum)
Your strep test is positive and your influenza is negative. Stop by the pharmacy to pick up your prescriptions.  Follow up with your primary care provider as needed.

## 2022-03-06 NOTE — ED Provider Notes (Signed)
[facility]    CSN: [id number] Arrival date & time: 03/06/22  1213      History   Chief Complaint Chief Complaint  Patient presents with   Sore Throat   Nasal Congestion   Cough   Otalgia    HPI [name] is a 37 y.o. female.   HPI   [name] presents for sore throat that started yesterday. Has nasal congestion and cough that started 2 days ago. Her husband has the flu and her daughter has flu and strep.    She had ear pain for about a month that is worse now after her sore throat started.       Past Medical History:  Diagnosis Date   Orthostatic lightheadedness    POTS (postural orthostatic tachycardia syndrome)    Tachycardia    Wisdom teeth extracted     Patient Active Problem List   Diagnosis Date Noted   Previous cesarean section 02/26/2020   Postoperative state 02/26/2020   Encounter for supervision of normal pregnancy in third trimester 08/18/2019   Delivery with history of cesarean section 01/05/2018   Post-operative state 07/01/2014   Labor and delivery, indication for care 06/29/2014   POTS (postural orthostatic tachycardia syndrome) 11/23/2013   Tachycardia     Past Surgical History:  Procedure Laterality Date   CESAREAN SECTION N/A 06/30/2014   Procedure: CESAREAN SECTION;  Surgeon: Boykin Nearing, MD;  Location: [facility];  Service: Obstetrics;  Laterality: N/A;   CESAREAN SECTION N/A 01/05/2018   Procedure: CESAREAN SECTION, repeat;  Surgeon: Schermerhorn, Gwen Her, MD;  Location: [facility];  Service: Obstetrics;  Laterality: N/A;   CESAREAN SECTION WITH BILATERAL TUBAL LIGATION N/A 02/26/2020   Procedure: REPEAT CESAREAN SECTION WITH BILATERAL TUBAL LIGATION;  Surgeon: Boykin Nearing, MD;  Location: [facility];  Service: Obstetrics;  Laterality: N/A;   DILATION AND EVACUATION N/A 11/04/2016   Procedure: DILATATION AND EVACUATION;  Surgeon: Schermerhorn, Gwen Her, MD;  Location: [facility];  Service: Gynecology;   Laterality: N/A;    OB History     Gravida  4   Para  3   Term  3   Preterm  0   AB  1   Living  3      SAB  1   IAB  0   Ectopic  0   Multiple  0   Live Births  3            Home Medications    Prior to Admission medications   Medication Sig Start Date End Date Taking? Authorizing Provider  acetaminophen (TYLENOL) 325 MG tablet Take 650 mg by mouth every 6 (six) hours as needed for moderate pain or headache.   Yes [provider]  diphenhydrAMINE (BENADRYL) 25 MG tablet Take 25 mg by mouth daily as needed for allergies.   Yes [provider]  ferrous sulfate 325 (65 FE) MG tablet Take 325 mg by mouth daily.   Yes [provider]  oxyCODONE (OXY IR/ROXICODONE) 5 MG immediate release tablet Take 1-2 tablets (5-10 mg total) by mouth every 4 (four) hours as needed for moderate pain. 02/28/20  Yes Linda Hedges, CNM  Prenatal Vit-Fe Fumarate-FA (PRENATAL MULTIVITAMIN) TABS tablet Take 1 tablet by mouth every evening.    Yes [provider]  amoxicillin (AMOXIL) 875 MG tablet Take 1 tablet (875 mg total) by mouth 2 (two) times daily for 10 days. 03/06/22 03/16/22 Yes Lyndee Hensen, DO    Family  History History reviewed. No pertinent family history.  Social History Social History   Tobacco Use   Smoking status: Never   Smokeless tobacco: Never  Vaping Use   Vaping Use: Never used  Substance Use Topics   Alcohol use: No   Drug use: No     Allergies   Patient has no known allergies.   Review of Systems Review of Systems: negative unless otherwise stated in HPI.      Physical Exam Triage Vital Signs ED Triage Vitals  Enc Vitals Group     BP 03/06/22 1223 113/73     Pulse Rate 03/06/22 1223 87     Resp 03/06/22 1223 16     Temp 03/06/22 1223 98.4 F (36.9 C)     Temp Source 03/06/22 1223 Oral     SpO2 03/06/22 1223 99 %     Weight 03/06/22 1222 126 lb (57.2 kg)     Height 03/06/22 1222 5\' 4"  (1.626 m)      Head Circumference --      Peak Flow --      Pain Score 03/06/22 1221 7     Pain Loc --      Pain Edu? --      Excl. in [place]? --    No data found.  Updated Vital Signs BP 113/73 (BP Location: Left Arm)   Pulse 87   Temp 98.4 F (36.9 C) (Oral)   Resp 16   Ht 5\' 4"  (1.626 m)   Wt 57.2 kg   LMP 02/20/2022 (Approximate)   SpO2 99%   Breastfeeding No   BMI 21.63 kg/m   Visual Acuity Right Eye Distance:   Left Eye Distance:   Bilateral Distance:    Right Eye Near:   Left Eye Near:    Bilateral Near:     Physical Exam GEN:     alert, non-ill appearing female in no distress    HENT:  mucus membranes moist, oropharyngeal without lesions or exudate, no tonsillar hypertrophy, mild oropharyngeal erythema, no nasal discharge, right TM normal, left TM erythematous but no bulging or effusion  EYES:   pupils equal and reactive, no scleral injection or discharge NECK:  normal ROM RESP:  no increased work of breathing, clear to auscultation bilaterally CVS:   regular rate and rhythm Skin:   warm and dry    UC Treatments / Results  Labs (all labs ordered are listed, but only abnormal results are displayed) Labs Reviewed  GROUP A STREP BY PCR - Abnormal; Notable for the following components:      Result Value   Group A Strep by PCR DETECTED (*)    All other components within normal limits  RAPID INFLUENZA A&B ANTIGENS    EKG   Radiology No results found.  Procedures Procedures (including critical care time)  Medications Ordered in UC Medications - No data to display  Initial Impression / Assessment and Plan / UC Course  I have reviewed the triage vital signs and the nursing notes.  Pertinent labs & imaging results that were available during my care of the patient were reviewed by me and considered in my medical decision making (see chart for details).       Pt is a 37 y.o. female who presents for 2 days of respiratory symptoms. [name] is afebrile here without  recent antipyretics. Satting well on room air. Overall pt is non-toxic appearing, well hydrated, without respiratory distress. Pulmonary exam is unremarkable.  She has recent  exposure to both influenza and strep. Influenza A and B were negative however strep PCR is positive.  Treat with amoxicillin as below. Typical duration of symptoms discussed.   Return and ED precautions given and voiced understanding. Discussed MDM, treatment plan and plan for follow-up with patient who agrees with plan.     Final Clinical Impressions(s) / UC Diagnoses   Final diagnoses:  Strep pharyngitis     Discharge Instructions      Your strep test is positive and your influenza is negative. Stop by the pharmacy to pick up your prescriptions.  Follow up with your primary care provider as needed.      ED Prescriptions     Medication Sig Dispense Auth. Provider   amoxicillin (AMOXIL) 875 MG tablet Take 1 tablet (875 mg total) by mouth 2 (two) times daily for 10 days. 20 tablet Lyndee Hensen, DO      PDMP not reviewed this encounter.   Lyndee Hensen, DO 03/06/22 1315
